# Patient Record
Sex: Female | Born: 1995 | Race: Black or African American | Hispanic: No | Marital: Single | Smoking: Never smoker
Health system: Southern US, Community
[De-identification: ages and names within clinical notes are randomized; demographics above are authoritative.]

## PROBLEM LIST (undated history)

## (undated) DIAGNOSIS — Z789 Other specified health status: Secondary | ICD-10-CM

## (undated) DIAGNOSIS — J45909 Unspecified asthma, uncomplicated: Secondary | ICD-10-CM

## (undated) DIAGNOSIS — R519 Headache, unspecified: Secondary | ICD-10-CM

## (undated) DIAGNOSIS — R87629 Unspecified abnormal cytological findings in specimens from vagina: Secondary | ICD-10-CM

## (undated) DIAGNOSIS — G4730 Sleep apnea, unspecified: Secondary | ICD-10-CM

## (undated) HISTORY — DX: Unspecified asthma, uncomplicated: J45.909

## (undated) HISTORY — DX: Unspecified abnormal cytological findings in specimens from vagina: R87.629

## (undated) HISTORY — PX: OTHER SURGICAL HISTORY: SHX169

## (undated) HISTORY — DX: Other specified health status: Z78.9

## (undated) HISTORY — DX: Headache, unspecified: R51.9

## (undated) HISTORY — DX: Sleep apnea, unspecified: G47.30

## (undated) HISTORY — PX: NO PAST SURGERIES: SHX2092

---
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
## 2016-05-17 ENCOUNTER — Encounter (HOSPITAL_COMMUNITY): Payer: Self-pay | Admitting: Emergency Medicine

## 2016-05-17 ENCOUNTER — Ambulatory Visit (HOSPITAL_COMMUNITY)
Admission: EM | Admit: 2016-05-17 | Discharge: 2016-05-17 | Disposition: A | Discharge status: Home / Self Care | Payer: BLUE CROSS/BLUE SHIELD | Attending: Internal Medicine | Admitting: Internal Medicine

## 2016-05-17 DIAGNOSIS — R05 Cough: Secondary | ICD-10-CM | POA: Diagnosis not present

## 2016-05-17 DIAGNOSIS — R062 Wheezing: Secondary | ICD-10-CM | POA: Diagnosis not present

## 2016-05-17 DIAGNOSIS — J209 Acute bronchitis, unspecified: Secondary | ICD-10-CM

## 2016-05-17 DIAGNOSIS — R059 Cough, unspecified: Secondary | ICD-10-CM

## 2016-05-17 MED ORDER — SODIUM CHLORIDE 0.9 % IN NEBU
INHALATION_SOLUTION | RESPIRATORY_TRACT | Status: AC
  Filled 2016-05-17: qty 3

## 2016-05-17 MED ORDER — ALBUTEROL SULFATE (2.5 MG/3ML) 0.083% IN NEBU
INHALATION_SOLUTION | RESPIRATORY_TRACT | Status: AC
  Filled 2016-05-17: qty 3

## 2016-05-17 MED ORDER — BENZONATATE 100 MG PO CAPS: 200.0000 mg | ORAL_CAPSULE | Freq: Three times a day (TID) | ORAL | 0 refills | Status: DC | PRN

## 2016-05-17 MED ORDER — ALBUTEROL SULFATE (2.5 MG/3ML) 0.083% IN NEBU
2.5000 mg | INHALATION_SOLUTION | Freq: Once | RESPIRATORY_TRACT | Status: AC
  Administered 2016-05-17: 2.5 mg via RESPIRATORY_TRACT

## 2016-05-17 MED ORDER — AZITHROMYCIN 250 MG PO TABS: 250.0000 mg | ORAL_TABLET | Freq: Every day | ORAL | 0 refills | Status: DC

## 2016-05-17 MED ORDER — METHYLPREDNISOLONE 4 MG PO TBPK: ORAL_TABLET | ORAL | 0 refills | Status: DC

## 2016-05-17 NOTE — ED Triage Notes (Signed)
The patient presented to the [facility] with a complaint of a cough x 1 month. 

## 2016-05-17 NOTE — ED Provider Notes (Signed)
CSN: [id number]     Arrival date & time 05/17/16  1854 History   None    Chief Complaint  Patient presents with  . Cough   (Consider location/radiation/quality/duration/timing/severity/associated sxs/prior Treatment) Patient c/o cough for a month.     The history is provided by the patient.  Cough  Cough characteristics:  Productive Sputum characteristics:  White Severity:  Moderate Onset quality:  Sudden Duration:  4 weeks Timing:  Constant Progression:  Worsening Chronicity:  New Smoker: no   Context: upper respiratory infection and weather changes   Relieved by:  Nothing Worsened by:  Nothing Ineffective treatments:  None tried   History reviewed. No pertinent past medical history. History reviewed. No pertinent surgical history. History reviewed. No pertinent family history. Social History  Substance Use Topics  . Smoking status: Never Smoker  . Smokeless tobacco: Never Used  . Alcohol use No   OB History    No data available     Review of Systems  Constitutional: Negative.   HENT: Negative.   Eyes: Negative.   Respiratory: Positive for cough.   Cardiovascular: Negative.   Gastrointestinal: Negative.   Endocrine: Negative.   Genitourinary: Negative.   Musculoskeletal: Negative.   Allergic/Immunologic: Negative.   Neurological: Negative.   Hematological: Negative.   Psychiatric/Behavioral: Negative.     Allergies  Patient has no known allergies.  Home Medications   Prior to Admission medications   Medication Sig Start Date End Date Taking? Authorizing Provider  azithromycin (ZITHROMAX) 250 MG tablet Take 1 tablet (250 mg total) by mouth daily. Take first 2 tablets together, then 1 every day until finished. 05/17/16   Deatra Canter, FNP  benzonatate (TESSALON) 100 MG capsule Take 2 capsules (200 mg total) by mouth 3 (three) times daily as needed for cough. 05/17/16   Deatra Canter, FNP  methylPREDNISolone (MEDROL DOSEPAK) 4 MG TBPK tablet Take  6-5-4-3-2-1 po qd 05/17/16   Deatra Canter, FNP   Meds Ordered and Administered this Visit   Medications  albuterol (PROVENTIL) (2.5 MG/3ML) 0.083% nebulizer solution 2.5 mg (2.5 mg Nebulization Given 05/17/16 2007)    BP (!) 146/90 (BP Location: Right Arm) Comment: notified cma  Pulse (!) 109 Comment: notified cma  Temp 98.3 F (36.8 C) (Oral)   Resp 16   SpO2 99%  No data found.   Physical Exam  Constitutional: She appears well-developed and well-nourished.  HENT:  Head: Normocephalic and atraumatic.  Right Ear: External ear normal.  Left Ear: External ear normal.  Mouth/Throat: Oropharynx is clear and moist.  Eyes: Conjunctivae and EOM are normal. Pupils are equal, round, and reactive to light.  Neck: Normal range of motion. Neck supple.  Cardiovascular: Normal rate, regular rhythm and normal heart sounds.   Pulmonary/Chest: Effort normal. She has wheezes.  Abdominal: Soft. Bowel sounds are normal.  Neurological: She is alert.  Nursing note and vitals reviewed.   Urgent Care Course     Procedures (including critical care time)  Labs Review Labs Reviewed - No data to display  Imaging Review No results found.   Visual Acuity Review  Right Eye Distance:   Left Eye Distance:   Bilateral Distance:    Right Eye Near:   Left Eye Near:    Bilateral Near:         MDM   1. Acute bronchitis, unspecified organism   2. Cough    Neb tx zpak Medrol dose pack Tessalon perles  Push po fluids, rest, tylenol and  motrin otc prn as directed for fever, arthralgias, and myalgias.  Follow up prn if sx's continue or persist.   Deatra Canter, FNP 05/17/16 2100

## 2016-09-03 ENCOUNTER — Ambulatory Visit (HOSPITAL_COMMUNITY)
Admission: EM | Admit: 2016-09-03 | Discharge: 2016-09-03 | Disposition: A | Discharge status: Home / Self Care | Payer: BLUE CROSS/BLUE SHIELD | Attending: Family Medicine | Admitting: Family Medicine

## 2016-09-03 ENCOUNTER — Encounter (HOSPITAL_COMMUNITY): Payer: Self-pay | Admitting: Family Medicine

## 2016-09-03 DIAGNOSIS — L308 Other specified dermatitis: Secondary | ICD-10-CM | POA: Diagnosis not present

## 2016-09-03 DIAGNOSIS — A084 Viral intestinal infection, unspecified: Secondary | ICD-10-CM

## 2016-09-03 MED ORDER — ONDANSETRON 8 MG PO TBDP: 8.0000 mg | ORAL_TABLET | Freq: Three times a day (TID) | ORAL | 0 refills | Status: DC | PRN

## 2016-09-03 MED ORDER — TRIAMCINOLONE ACETONIDE 0.1 % EX CREA: 1.0000 "application " | TOPICAL_CREAM | Freq: Two times a day (BID) | CUTANEOUS | 1 refills | Status: DC

## 2016-09-03 NOTE — ED Provider Notes (Signed)
[facility]    CSN: [id number] Arrival date & time: 09/03/16  1211     History   Chief Complaint Chief Complaint  Patient presents with  . Abdominal Pain    HPI Heather Barron is a 21 y.o. female.   This is a 21 year old woman who presents with an acute gastrointestinal upset including vomiting, epigastric discomfort, and diarrhea. Symptoms began yesterday and there is some improvement today although she has vomited earlier today and had some diarrhea. There's been no blood in the emesis or stool.  Patient works at [employer].      History reviewed. No pertinent past medical history.  There are no active problems to display for this patient.   History reviewed. No pertinent surgical history.  OB History    No data available       Home Medications    Prior to Admission medications   Medication Sig Start Date End Date Taking? Authorizing Provider  ondansetron (ZOFRAN-ODT) 8 MG disintegrating tablet Take 1 tablet (8 mg total) by mouth every 8 (eight) hours as needed for nausea. 09/03/16   Elvina Sidle, MD  triamcinolone cream (KENALOG) 0.1 % Apply 1 application topically 2 (two) times daily. 09/03/16   Elvina Sidle, MD    Family History No family history on file.  Social History Social History  Substance Use Topics  . Smoking status: Never Smoker  . Smokeless tobacco: Never Used  . Alcohol use No     Allergies   Patient has no known allergies.   Review of Systems Review of Systems  Gastrointestinal: Positive for abdominal pain, diarrhea, nausea and vomiting.  Neurological: Positive for light-headedness.  All other systems reviewed and are negative.    Physical Exam Triage Vital Signs ED Triage Vitals  Enc Vitals Group     BP      Pulse      Resp      Temp      Temp src      SpO2      Weight      Height      Head Circumference      Peak Flow      Pain Score      Pain Loc      Pain Edu?      Excl. in GC?     No data found.   Updated Vital Signs Pulse 80   Temp 98.5 F (36.9 C) (Oral)   Resp 16   SpO2 97%   Visual Acuity Right Eye Distance:   Left Eye Distance:   Bilateral Distance:    Right Eye Near:   Left Eye Near:    Bilateral Near:     Physical Exam  Constitutional: She is oriented to person, place, and time. She appears well-developed and well-nourished. No distress.  HENT:  Right Ear: External ear normal.  Left Ear: External ear normal.  Mouth/Throat: Oropharynx is clear and moist.  Eyes: Pupils are equal, round, and reactive to light.  Neck: Normal range of motion. Neck supple.  Pulmonary/Chest: Effort normal and breath sounds normal.  Abdominal: Soft. Bowel sounds are normal.  Musculoskeletal: Normal range of motion.  Neurological: She is alert and oriented to person, place, and time.  Skin: Skin is warm. Rash noted. She is not diaphoretic.  Nursing note and vitals reviewed.    UC Treatments / Results  Labs (all labs ordered are listed, but only abnormal results are displayed) Labs Reviewed - No data to  display  EKG  EKG Interpretation None       Radiology No results found.  Procedures Procedures (including critical care time)  Medications Ordered in UC Medications - No data to display   Initial Impression / Assessment and Plan / UC Course  I have reviewed the triage vital signs and the nursing notes.  Pertinent labs & imaging results that were available during my care of the patient were reviewed by me and considered in my medical decision making (see chart for details).     Final Clinical Impressions(s) / UC Diagnoses   Final diagnoses:  Viral gastroenteritis  Other eczema    New Prescriptions New Prescriptions   ONDANSETRON (ZOFRAN-ODT) 8 MG DISINTEGRATING TABLET    Take 1 tablet (8 mg total) by mouth every 8 (eight) hours as needed for nausea.   TRIAMCINOLONE CREAM (KENALOG) 0.1 %    Apply 1 application topically 2 (two) times  daily.     Controlled Substance Prescriptions [place] Controlled Substance Registry consulted? Not Applicable   Elvina Sidle, MD 09/03/16 1251

## 2016-09-03 NOTE — Discharge Instructions (Signed)
Stable with clear liquids today, crackers and toast this evening as tolerated.

## 2016-09-03 NOTE — ED Triage Notes (Signed)
PT reports generalized abdominal pain, vomiting and diarrhea that started this morning. PT ate something last night that may have been expired. PT reports vomit x2 and diarrhea x4.

## 2016-11-23 ENCOUNTER — Emergency Department (HOSPITAL_COMMUNITY): Payer: BLUE CROSS/BLUE SHIELD

## 2016-11-23 ENCOUNTER — Emergency Department (HOSPITAL_COMMUNITY)
Admission: EM | Admit: 2016-11-23 | Discharge: 2016-11-23 | Disposition: A | Discharge status: Home / Self Care | Payer: BLUE CROSS/BLUE SHIELD | Attending: Emergency Medicine | Admitting: Emergency Medicine

## 2016-11-23 ENCOUNTER — Encounter (HOSPITAL_COMMUNITY): Payer: Self-pay | Admitting: *Deleted

## 2016-11-23 DIAGNOSIS — M542 Cervicalgia: Secondary | ICD-10-CM | POA: Diagnosis present

## 2016-11-23 DIAGNOSIS — Z566 Other physical and mental strain related to work: Secondary | ICD-10-CM | POA: Insufficient documentation

## 2016-11-23 DIAGNOSIS — W010XXD Fall on same level from slipping, tripping and stumbling without subsequent striking against object, subsequent encounter: Secondary | ICD-10-CM | POA: Diagnosis not present

## 2016-11-23 DIAGNOSIS — Z79899 Other long term (current) drug therapy: Secondary | ICD-10-CM | POA: Diagnosis not present

## 2016-11-23 DIAGNOSIS — M436 Torticollis: Secondary | ICD-10-CM | POA: Insufficient documentation

## 2016-11-23 MED ORDER — METHOCARBAMOL 500 MG PO TABS: 500.0000 mg | ORAL_TABLET | Freq: Three times a day (TID) | ORAL | 0 refills | Status: AC | PRN

## 2016-11-23 NOTE — Discharge Instructions (Signed)
Take 600 mg ibuprofen + 1000 mg tylenol (acetominophen) + 500 mg robaxin every 8 hours (morning, afternoon, night) for the next 3-5 days. Heating pad, hot shower, light massage and neck stretches will help.   Return to ED if you develop fevers, chills, severe headache, vision changes, generalized rashes, confusion

## 2016-11-23 NOTE — ED Notes (Signed)
Pt reports stiff neck, worsening over past 5 days. Attempted icyhot with no relief.

## 2016-11-23 NOTE — ED Triage Notes (Signed)
C/o neck pain states she fell 5 days ago c/o pain onset 4 days ago , states she has been working a lot and now she is hoarse.

## 2016-11-23 NOTE — ED Provider Notes (Signed)
[facility] EMERGENCY DEPARTMENT Provider Note   CSN: [id number] Arrival date & time: 11/23/16  [id number]     History   Chief Complaint Chief Complaint  Patient presents with  . Torticollis    HPI Heather Barron is a 21 y.o. female presents to the ED for gradually worsening diffuse neck pain associated with tightness 4 days. Pain is worse with neck rotation to the right and palpation. Has tried icy hot with minimal relief. Reports she fell 5 days ago, one day before her symptoms started. She tripped and fell forward on her knees, there was no head trauma or LOC. 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 No fevers, chills, confusion, generalized rash, changes in vision or exposure to meningitis. She is up-to-date on vaccinations. Has no other complaints.  HPI  History reviewed. No pertinent past medical history.  There are no active problems to display for this patient.   History reviewed. No pertinent surgical history.  OB History    No data available       Home Medications    Prior to Admission medications   Medication Sig Start Date End Date Taking? Authorizing Provider  methocarbamol (ROBAXIN) 500 MG tablet Take 1 tablet (500 mg total) every 8 (eight) hours as needed for up to 5 days by mouth for muscle spasms. 11/23/16 11/28/16  Liberty HandyGibbons, Caldwell Kronenberger J, PA-C  ondansetron (ZOFRAN-ODT) 8 MG disintegrating tablet Take 1 tablet (8 mg total) by mouth every 8 (eight) hours as needed for nausea. 09/03/16   Elvina SidleLauenstein, Kurt, MD  triamcinolone cream (KENALOG) 0.1 % Apply 1 application topically 2 (two) times daily. 09/03/16   Elvina SidleLauenstein, Kurt, MD    Family History No family history on file.  Social History Social History   Tobacco Use  . Smoking status: Never Smoker  . Smokeless tobacco: Never Used  Substance  Use Topics  . Alcohol use: No  . Drug use: No     Allergies   Patient has no known allergies.   Review of Systems Review of Systems  All other systems reviewed and are negative.    Physical Exam Updated Vital Signs BP 118/69 (BP Location: Right Arm)   Pulse 93   Temp 99.2 F (37.3 C) (Oral)   Resp 20   Ht 5\' 4"  (1.626 m)   Wt 104.3 kg (230 lb)   LMP 11/10/2016   SpO2 99%   BMI 39.48 kg/m   Physical Exam  Constitutional: She is oriented to person, place, and time. She appears well-developed and well-nourished. No distress.  NAD.  HENT:  Head: Normocephalic and atraumatic.  Right Ear: External ear normal.  Left Ear: External ear normal.  Nose: Nose normal.  Eyes: Conjunctivae are normal. No scleral icterus.  PERRL and EOMs intact bilaterally  Neck: Neck supple. Muscular tenderness present. Decreased range of motion present.    Diffuse muscular tenderness to paraspinal c-spine No midline C-spine tenderness or step-offs She is full passive range of motion of her cervical spine with mild pain No meningeal signs  Cardiovascular: Normal rate, regular rhythm and normal heart sounds.  No murmur heard. Pulmonary/Chest: Effort normal and breath sounds normal. She has no wheezes.  Musculoskeletal: She exhibits no deformity.  Neurological: She is alert and oriented to person, place, and time.  No dysarthria or nystagmus.  Strength 5/5 with hand  grip and ankle flexion/extension.   Sensation to light touch intact in hands and feet. Steady gait.  CN I and VIII not tested. CN II-XII intact bilaterally.   Skin: Skin is warm and dry. Capillary refill takes less than 2 seconds.  Psychiatric: She has a normal mood and affect. Her behavior is normal. Judgment and thought content normal.  Nursing note and vitals reviewed.    ED Treatments / Results  Labs (all labs ordered are listed, but only abnormal results are displayed) Labs Reviewed - No data to display  EKG  EKG  Interpretation None       Radiology Dg Cervical Spine Complete  Result Date: 11/23/2016 CLINICAL DATA:  Fall 5 days ago. The patient has been quite active with or since then and has increased back pain. EXAM: CERVICAL SPINE - COMPLETE 4+ VIEW COMPARISON:  None in PACs FINDINGS: The cervical vertebral bodies are preserved in height. The disc space heights are well maintained. The oblique views reveal no significant bony encroachment upon the neural foramina. The spinous processes are grossly intact. The odontoid is intact. The prevertebral soft tissue spaces are normal. IMPRESSION: No acute compression fracture nor other acute bony abnormality is observed. Electronically Signed   By: David  SwazilandJordan M.D.   On: 11/23/2016 07:46    Procedures Procedures (including critical care time)  Medications Ordered in ED Medications - No data to display   Initial Impression / Assessment and Plan / ED Course  I have reviewed the triage vital signs and the nursing notes.  Pertinent labs & imaging results that were available during my care of the patient were reviewed by me and considered in my medical decision making (see chart for details).    [age] year old female presents with gradually worsening neck pain after mechanical fall from standing height.Exam is reassuring, she has mild diffuse tenderness over paraspinal cervical muscles but no midline C-spine tenderness or step-offs. Triage ordered x-ray of cervical spine is unremarkable. She has no meningeal signs. Cranial nerves and upper extremities are neurovascularly intact. Will discharge with conservative measures and f/u for re-eval for persistent symptoms. Discussed s/s that would warrant prompt return to ED for re-evaluation. Pt requested work note for 4 days, given.   Final Clinical Impressions(s) / ED Diagnoses   Final diagnoses:  Torticollis, acute    ED Discharge Orders        Ordered    methocarbamol (ROBAXIN) 500 MG tablet  Every 8 hours  PRN     11/23/16 0950       Liberty HandyGibbons, Christain Mcraney J, PA-C 11/23/16 1010    Melene PlanFloyd, Dan, DO 11/23/16 1430

## 2017-01-12 ENCOUNTER — Emergency Department
Admission: EM | Admit: 2017-01-12 | Discharge: 2017-01-12 | Disposition: A | Discharge status: Home / Self Care | Payer: BLUE CROSS/BLUE SHIELD | Attending: Emergency Medicine | Admitting: Emergency Medicine

## 2017-01-12 ENCOUNTER — Other Ambulatory Visit: Payer: Self-pay

## 2017-01-12 ENCOUNTER — Encounter: Payer: Self-pay | Admitting: Emergency Medicine

## 2017-01-12 ENCOUNTER — Emergency Department: Payer: BLUE CROSS/BLUE SHIELD

## 2017-01-12 DIAGNOSIS — Z79899 Other long term (current) drug therapy: Secondary | ICD-10-CM | POA: Diagnosis not present

## 2017-01-12 DIAGNOSIS — G5761 Lesion of plantar nerve, right lower limb: Secondary | ICD-10-CM | POA: Diagnosis not present

## 2017-01-12 DIAGNOSIS — M79671 Pain in right foot: Secondary | ICD-10-CM | POA: Diagnosis present

## 2017-01-12 MED ORDER — PREDNISONE 10 MG PO TABS: 20.0000 mg | ORAL_TABLET | Freq: Two times a day (BID) | ORAL | 0 refills | Status: AC

## 2017-01-12 MED ORDER — PREDNISONE 20 MG PO TABS
20.0000 mg | ORAL_TABLET | Freq: Once | ORAL | Status: AC
  Administered 2017-01-12: 20 mg via ORAL
  Filled 2017-01-12: qty 1

## 2017-01-12 NOTE — ED Provider Notes (Signed)
[facility] Emergency Department Provider Note ____________________________________________  Time seen: [id number]  I have reviewed the triage vital signs and the nursing notes.  HISTORY  Chief Complaint  Foot Pain  HPI Heather Barron is a 21 y.o. female resents herself to the ED for evaluation of sudden right foot pain with onset yesterday.  Patient describes pain to the balls of her first through third toes.  She describes the pain discomfort as exquisite in nature and she is unable to bear weight through the toes, due to pain. She denies any trauma, contusion, or sprain. She has applied Ben-gay, without benefit. She denies any ankle or calf tenderness.   History reviewed. No pertinent past medical history.  There are no active problems to display for this patient.  History reviewed. No pertinent surgical history.  Prior to Admission medications   Medication Sig Start Date End Date Taking? Authorizing Provider  ondansetron (ZOFRAN-ODT) 8 MG disintegrating tablet Take 1 tablet (8 mg total) by mouth every 8 (eight) hours as needed for nausea. 09/03/16   Elvina SidleLauenstein, Kurt, MD  predniSONE (DELTASONE) 10 MG tablet Take 2 tablets (20 mg total) by mouth 2 (two) times daily with a meal for 5 days. 01/12/17 01/17/17  Ladeja Pelham, Charlesetta IvoryJenise V Bacon, PA-C  triamcinolone cream (KENALOG) 0.1 % Apply 1 application topically 2 (two) times daily. 09/03/16   Elvina SidleLauenstein, Kurt, MD    Allergies Patient has no known allergies.  History reviewed. No pertinent family history.  Social History Social History   Tobacco Use  . Smoking status: Never Smoker  . Smokeless tobacco: Never Used  Substance Use Topics  . Alcohol use: No  . Drug use: No    Review of Systems  Constitutional: Negative for fever. Musculoskeletal: Negative for back pain. Right foot pain as above.  Skin: Negative for rash. Neurological: Negative for headaches, focal weakness or  numbness. ____________________________________________  PHYSICAL EXAM:  VITAL SIGNS: ED Triage Vitals  Enc Vitals Group     BP 01/12/17 1700 100/71     Pulse Rate 01/12/17 1700 95     Resp 01/12/17 1700 18     Temp 01/12/17 1700 98.1 F (36.7 C)     Temp Source 01/12/17 1700 Oral     SpO2 01/12/17 1700 99 %     Weight 01/12/17 1702 240 lb (108.9 kg)     Height 01/12/17 1700 5\' 4"  (1.626 m)     Head Circumference --      Peak Flow --      Pain Score 01/12/17 1700 10     Pain Loc --      Pain Edu? --      Excl. in GC? --     Constitutional: Alert and oriented. Well appearing and in no distress. Head: Normocephalic and atraumatic. Cardiovascular: Normal rate, regular rhythm. Normal distal pulses.  Capillary refill. Respiratory: Normal respiratory effort.  Musculoskeletal: Right foot without obvious deformity, effusion, or dislocation.  Patient is exquisitely tender to palpation to the plantar surface of her first through third MTPs.  She is able to demonstrate normal toe flexion/ extension range.  She has no ankle dysfunction.  No calf or Achilles tenderness is elicited.  Nontender with normal range of motion in all extremities.  Neurologic: Antalgic gait without ataxia. Normal gross sensation. No gross focal neurologic deficits are appreciated. Skin:  Skin is warm, dry and intact. No rash noted. ___________________________________________   RADIOLOGY Right Foot  IMPRESSION: No acute fracture or dislocation identified about  the right foot. Midfoot soft tissue swelling. ____________________________________________  PROCEDURES  Procedures Prednisone 20 mg PO Crutches Ace bandage ____________________________________________  INITIAL IMPRESSION / ASSESSMENT AND PLAN / ED COURSE  She with a ED evaluation of sudden plantar foot pain to the MTPs.  Her symptoms appear consistent with a Morton's neuroma.  Patient's x-rays negative for any acute fracture dislocation, and her  story is does not give any significant mechanism of injury.  She will be fitted with crutches to ambulate with weightbearing as tolerated.  She is referred to podiatry for further evaluation management.  A prescription for prednisone will be provided for her to take as directed.   ____________________________________________  FINAL CLINICAL IMPRESSION(S) / ED DIAGNOSES  Final diagnoses:  Morton's neuroma of right foot      Lissa HoardMenshew, Raelan Burgoon V Bacon, PA-C 01/12/17 1833    Jeanmarie PlantMcShane, James A, MD 01/12/17 2040

## 2017-01-12 NOTE — ED Triage Notes (Signed)
Pt presents with right foot pain since yesterday. Pt states she has pain when bearing weight. Pt reports pain on the bottom of the foot. Pt alert & oriented with NAD noted.

## 2017-01-12 NOTE — Discharge Instructions (Addendum)
Take the prescription steroids as directed. Wear the ace bandage and use the crutches as directed. Rest with the foot elevated. Apply warm compresses or use foot soaks to reduce pain. Follow-up with Dr. Ether GriffinsFowler for continued symptoms.

## 2017-01-12 NOTE — ED Notes (Signed)

## 2017-05-09 ENCOUNTER — Encounter: Payer: Self-pay | Admitting: Emergency Medicine

## 2017-05-09 ENCOUNTER — Emergency Department: Payer: Self-pay

## 2017-05-09 ENCOUNTER — Other Ambulatory Visit: Payer: Self-pay

## 2017-05-09 ENCOUNTER — Emergency Department
Admission: EM | Admit: 2017-05-09 | Discharge: 2017-05-09 | Disposition: A | Discharge status: Home / Self Care | Payer: Self-pay | Attending: Emergency Medicine | Admitting: Emergency Medicine

## 2017-05-09 DIAGNOSIS — M79672 Pain in left foot: Secondary | ICD-10-CM | POA: Insufficient documentation

## 2017-05-09 MED ORDER — NAPROXEN 500 MG PO TABS: 500.0000 mg | ORAL_TABLET | Freq: Two times a day (BID) | ORAL | Status: DC

## 2017-05-09 MED ORDER — TRAMADOL HCL 50 MG PO TABS: 50.0000 mg | ORAL_TABLET | Freq: Two times a day (BID) | ORAL | 0 refills | Status: DC | PRN

## 2017-05-09 MED ORDER — NAPROXEN 500 MG PO TABS
500.0000 mg | ORAL_TABLET | Freq: Once | ORAL | Status: AC
  Administered 2017-05-09: 500 mg via ORAL
  Filled 2017-05-09: qty 1

## 2017-05-09 NOTE — Discharge Instructions (Addendum)
Wear open shoe 2-3 days as needed.

## 2017-05-09 NOTE — ED Notes (Signed)
Splint applied to left foot per order.

## 2017-05-09 NOTE — ED Provider Notes (Signed)
[facility] Emergency Department Provider Note   ____________________________________________   First MD Initiated Contact with Patient 05/09/17 1049     (approximate)  I have reviewed the triage vital signs and the nursing notes.   HISTORY  Chief Complaint Foot Pain    HPI Heather Barron is a 22 y.o. female patient complain of left foot pain secondary to running incident yesterday.  Patient states she felt a" crack" in her foot yesterday.  Patient states pain increased with weightbearing.  Patient rates pain as a 2/10.  Patient described the pain as "achy".  No past medical history on file.  There are no active problems to display for this patient.   History reviewed. No pertinent surgical history.  Prior to Admission medications   Medication Sig Start Date End Date Taking? Authorizing Provider  naproxen (NAPROSYN) 500 MG tablet Take 1 tablet (500 mg total) by mouth 2 (two) times daily with a meal. 05/09/17   Joni ReiningSmith, Kajsa Butrum K, PA-C  ondansetron (ZOFRAN-ODT) 8 MG disintegrating tablet Take 1 tablet (8 mg total) by mouth every 8 (eight) hours as needed for nausea. 09/03/16   Elvina SidleLauenstein, Kurt, MD  traMADol (ULTRAM) 50 MG tablet Take 1 tablet (50 mg total) by mouth every 12 (twelve) hours as needed. 05/09/17   Joni ReiningSmith, Latorsha Curling K, PA-C  triamcinolone cream (KENALOG) 0.1 % Apply 1 application topically 2 (two) times daily. 09/03/16   Elvina SidleLauenstein, Kurt, MD    Allergies Patient has no known allergies.  No family history on file.  Social History Social History   Tobacco Use  . Smoking status: Never Smoker  . Smokeless tobacco: Never Used  Substance Use Topics  . Alcohol use: No  . Drug use: No    Review of Systems Constitutional: No fever/chills Eyes: No visual changes. ENT: No sore throat. Cardiovascular: Denies chest pain. Respiratory: Denies shortness of breath. Gastrointestinal: No abdominal pain.  No nausea, no vomiting.  No diarrhea.  No  constipation. Genitourinary: Negative for dysuria. Musculoskeletal: Negative for back pain. Skin: Negative for rash. Neurological: Negative for headaches, focal weakness or numbness.   ____________________________________________   PHYSICAL EXAM:  VITAL SIGNS: ED Triage Vitals  Enc Vitals Group     BP 05/09/17 1111 (!) 120/58     Pulse Rate 05/09/17 1111 86     Resp 05/09/17 1111 16     Temp 05/09/17 1111 98.2 F (36.8 C)     Temp Source 05/09/17 1111 Oral     SpO2 05/09/17 1111 99 %     Weight 05/09/17 1112 284 lb (128.8 kg)     Height 05/09/17 1112 5\' 4"  (1.626 m)     Head Circumference --      Peak Flow --      Pain Score 05/09/17 1111 2     Pain Loc --      Pain Edu? --      Excl. in GC? --    Constitutional: Alert and oriented. Well appearing and in no acute distress.  Morbid obesity Cardiovascular: Normal rate, regular rhythm. Grossly normal heart sounds.  Good peripheral circulation. Respiratory: Normal respiratory effort.  No retractions. Lungs CTAB. Musculoskeletal: No obvious deformity to the left foot.  Patient has moderate guarding palpation Neurologic:  Normal speech and language. No gross focal neurologic deficits are appreciated. No gait instability. Skin:  Skin is warm, dry and intact. No rash noted. Psychiatric: Mood and affect are normal. Speech and behavior are normal.  ____________________________________________   LABS (  all labs ordered are listed, but only abnormal results are displayed)  Labs Reviewed - No data to display ____________________________________________  EKG   ____________________________________________  RADIOLOGY  No acute findings x-ray of the right foot.  Official radiology report(s): Dg Foot Complete Left  Result Date: 05/09/2017 CLINICAL DATA:  Running yesterday with pain in the bottom of foot. Initial encounter. EXAM: LEFT FOOT - COMPLETE 3+ VIEW COMPARISON:  None. FINDINGS: There is no evidence of fracture or  dislocation. There is no evidence of arthropathy or other focal bone abnormality. Soft tissues are unremarkable. IMPRESSION: Negative. Electronically Signed   By: Marnee Spring M.D.   On: 05/09/2017 12:06    ____________________________________________   PROCEDURES  Procedure(s) performed: None  Procedures  Critical Care performed: No  ____________________________________________   INITIAL IMPRESSION / ASSESSMENT AND PLAN / ED COURSE  As part of my medical decision making, I reviewed the following data within the electronic MEDICAL RECORD NUMBER    Right foot pain secondary to sprain.  Discussed negative x-ray findings with patient.  Patient given discharge care instruction advised take medication as directed.  [facility].      ____________________________________________   FINAL CLINICAL IMPRESSION(S) / ED DIAGNOSES  Final diagnoses:  Foot pain, left     ED Discharge Orders        Ordered    naproxen (NAPROSYN) 500 MG tablet  2 times daily with meals     05/09/17 1225    traMADol (ULTRAM) 50 MG tablet  Every 12 hours PRN     05/09/17 1225       Note:  This document was prepared using Dragon voice recognition software and may include unintentional dictation errors.    Joni Reining, PA-C 05/09/17 1227    Arnaldo Natal, MD 05/09/17 [phone]

## 2017-05-09 NOTE — ED Notes (Signed)
Pt resting in bed, no complaints at this time.

## 2017-05-09 NOTE — ED Triage Notes (Signed)
Running yesterday.  Today pain bottom of foot and cant bear weigt.  Says she heard a crack yesterday.

## 2017-06-13 ENCOUNTER — Other Ambulatory Visit: Payer: Self-pay

## 2017-06-13 ENCOUNTER — Encounter: Payer: Self-pay | Admitting: Emergency Medicine

## 2017-06-13 DIAGNOSIS — J020 Streptococcal pharyngitis: Secondary | ICD-10-CM | POA: Insufficient documentation

## 2017-06-13 NOTE — ED Triage Notes (Signed)
Patient ambulatory to triage with steady gait, without difficulty or distress noted; pt reports sore throat x 4 days with no accomp symptoms

## 2017-06-14 ENCOUNTER — Emergency Department
Admission: EM | Admit: 2017-06-14 | Discharge: 2017-06-14 | Disposition: A | Discharge status: Home / Self Care | Payer: Self-pay | Attending: Emergency Medicine | Admitting: Emergency Medicine

## 2017-06-14 DIAGNOSIS — J020 Streptococcal pharyngitis: Secondary | ICD-10-CM

## 2017-06-14 LAB — GROUP A STREP BY PCR: Group A Strep by PCR: DETECTED — AB

## 2017-06-14 MED ORDER — LIDOCAINE VISCOUS HCL 2 % MT SOLN: 15.0000 mL | OROMUCOSAL | 0 refills | Status: DC | PRN

## 2017-06-14 MED ORDER — AMOXICILLIN 500 MG PO CAPS
1000.0000 mg | ORAL_CAPSULE | Freq: Once | ORAL | Status: AC
  Administered 2017-06-14: 1000 mg via ORAL
  Filled 2017-06-14: qty 2

## 2017-06-14 MED ORDER — DEXAMETHASONE 10 MG/ML FOR PEDIATRIC ORAL USE
10.0000 mg | Freq: Once | INTRAMUSCULAR | Status: AC
  Administered 2017-06-14: 10 mg via ORAL

## 2017-06-14 MED ORDER — KETOROLAC TROMETHAMINE 60 MG/2ML IM SOLN
60.0000 mg | Freq: Once | INTRAMUSCULAR | Status: AC
  Administered 2017-06-14: 60 mg via INTRAMUSCULAR
  Filled 2017-06-14: qty 2

## 2017-06-14 MED ORDER — DEXAMETHASONE SODIUM PHOSPHATE 10 MG/ML IJ SOLN
INTRAMUSCULAR | Status: AC
  Administered 2017-06-14: 10 mg via ORAL
  Filled 2017-06-14: qty 1

## 2017-06-14 MED ORDER — TRAMADOL HCL 50 MG PO TABS: 50.0000 mg | ORAL_TABLET | Freq: Four times a day (QID) | ORAL | 0 refills | Status: DC | PRN

## 2017-06-14 MED ORDER — AMOXICILLIN 875 MG PO TABS: 875.0000 mg | ORAL_TABLET | Freq: Two times a day (BID) | ORAL | 0 refills | Status: DC

## 2017-06-14 NOTE — ED Provider Notes (Signed)
[facility] Emergency Department Provider Note   ____________________________________________   First MD Initiated Contact with Patient 06/14/17 0246     (approximate)  I have reviewed the triage vital signs and the nursing notes.   HISTORY  Chief Complaint Sore Throat    HPI Heather Barron is a 22 y.o. female who comes into the hospital today with a sore throat.  The patient has had the symptoms for the past 4 days.  The patient states she has been gargling with salt water to help her pain but it has not helped.  The patient denies any fevers, nausea, vomiting.  She is never had these symptoms before.  The patient denies any sick contacts.  She rates her pain a 10 out of 10 in intensity currently.  She is here today for evaluation.   History reviewed. No pertinent past medical history.  There are no active problems to display for this patient.   History reviewed. No pertinent surgical history.  Prior to Admission medications   Medication Sig Start Date End Date Taking? Authorizing Provider  amoxicillin (AMOXIL) 875 MG tablet Take 1 tablet (875 mg total) by mouth 2 (two) times daily. 06/14/17   Rebecka Apley, MD  lidocaine (XYLOCAINE) 2 % solution Use as directed 15 mLs in the mouth or throat every 4 (four) hours as needed for mouth pain. 06/14/17   Rebecka Apley, MD  naproxen (NAPROSYN) 500 MG tablet Take 1 tablet (500 mg total) by mouth 2 (two) times daily with a meal. 05/09/17   Joni Reining, PA-C  ondansetron (ZOFRAN-ODT) 8 MG disintegrating tablet Take 1 tablet (8 mg total) by mouth every 8 (eight) hours as needed for nausea. 09/03/16   Elvina Sidle, MD  traMADol (ULTRAM) 50 MG tablet Take 1 tablet (50 mg total) by mouth every 12 (twelve) hours as needed. 05/09/17   Joni Reining, PA-C  traMADol (ULTRAM) 50 MG tablet Take 1 tablet (50 mg total) by mouth every 6 (six) hours as needed. 06/14/17   Rebecka Apley, MD  triamcinolone  cream (KENALOG) 0.1 % Apply 1 application topically 2 (two) times daily. 09/03/16   Elvina Sidle, MD    Allergies Patient has no known allergies.  No family history on file.  Social History Social History   Tobacco Use  . Smoking status: Never Smoker  . Smokeless tobacco: Never Used  Substance Use Topics  . Alcohol use: No  . Drug use: No    Review of Systems  Constitutional: No fever/chills Eyes: No visual changes. ENT:  sore throat. Cardiovascular: Denies chest pain. Respiratory: Denies shortness of breath. Gastrointestinal: No abdominal pain.  No nausea, no vomiting.   Genitourinary: Negative for dysuria. Musculoskeletal: Negative for back pain. Skin: Negative for rash. Neurological: Negative for headaches   ____________________________________________   PHYSICAL EXAM:  VITAL SIGNS: ED Triage Vitals  Enc Vitals Group     BP 06/13/17 2349 118/87     Pulse Rate 06/13/17 2349 74     Resp 06/13/17 2349 18     Temp 06/13/17 2349 98.4 F (36.9 C)     Temp Source 06/13/17 2349 Oral     SpO2 06/13/17 2349 96 %     Weight 06/13/17 2336 287 lb (130.2 kg)     Height 06/13/17 2336  (1.626 m)     Head Circumference --      Peak Flow --      Pain Score 06/13/17 2336 10  Pain Loc --      Pain Edu? --      Excl. in GC? --     Constitutional: Alert and oriented. Well appearing and in mild distress. Eyes: Conjunctivae are normal. PERRL. EOMI. Head: Atraumatic. Nose: No congestion/rhinnorhea. Mouth/Throat: Mucous membranes are moist.  Oropharynx mildly erythematous.  No unilateral tonsil swelling Hematological/Lymphatic/Immunilogical: Anterior cervical lymphadenopathy. Cardiovascular: Normal rate, regular rhythm. Grossly normal heart sounds.  Good peripheral circulation. Respiratory: Normal respiratory effort.  No retractions. Lungs CTAB. Gastrointestinal: Soft and nontender. No distention.  Musculoskeletal: No lower extremity tenderness nor edema.     Neurologic:  Normal speech and language. Skin:  Skin is warm, dry and intact.  Psychiatric: Mood and affect are normal.   ____________________________________________   LABS (all labs ordered are listed, but only abnormal results are displayed)  Labs Reviewed  GROUP A STREP BY PCR - Abnormal; Notable for the following components:      Result Value   Group A Strep by PCR DETECTED (*)    All other components within normal limits   ____________________________________________  EKG  none ____________________________________________  RADIOLOGY  ED MD interpretation:  none  Official radiology report(s): No results found.  ____________________________________________   PROCEDURES  Procedure(s) performed: None  Procedures  Critical Care performed: No  ____________________________________________   INITIAL IMPRESSION / ASSESSMENT AND PLAN / ED COURSE  As part of my medical decision making, I reviewed the following data within the electronic MEDICAL RECORD NUMBER Notes from prior ED visits and [place] Controlled Substance Database   This is a 22 year old female who comes into the hospital today with some sore throat for the past 4 days.  We did check a strep on the patient and the strep did return positive.  I gave the patient a shot of Toradol, amoxicillin and some dexamethasone.  The patient's vital signs are unremarkable.  She will be discharged home to follow-up with the acute care clinic.      ____________________________________________   FINAL CLINICAL IMPRESSION(S) / ED DIAGNOSES  Final diagnoses:  Pharyngitis due to Streptococcus species  Strep throat     ED Discharge Orders        Ordered    lidocaine (XYLOCAINE) 2 % solution  Every 4 hours PRN     06/14/17 0335    amoxicillin (AMOXIL) 875 MG tablet  2 times daily     06/14/17 0335    traMADol (ULTRAM) 50 MG tablet  Every 6 hours PRN     06/14/17 0335       Note:  This document was prepared using  Dragon voice recognition software and may include unintentional dictation errors.    Rebecka Apley, MD 06/14/17 [phone]

## 2017-06-14 NOTE — Discharge Instructions (Addendum)
Please follow-up with your primary care physician, please return with any worsening pain any difficulty swallowing or any other concerns.

## 2017-08-11 ENCOUNTER — Emergency Department
Admission: EM | Admit: 2017-08-11 | Discharge: 2017-08-11 | Disposition: A | Discharge status: Home / Self Care | Payer: Self-pay | Attending: Emergency Medicine | Admitting: Emergency Medicine

## 2017-08-11 DIAGNOSIS — Z79899 Other long term (current) drug therapy: Secondary | ICD-10-CM | POA: Insufficient documentation

## 2017-08-11 DIAGNOSIS — L03311 Cellulitis of abdominal wall: Secondary | ICD-10-CM | POA: Insufficient documentation

## 2017-08-11 MED ORDER — CLINDAMYCIN HCL 300 MG PO CAPS: 300.0000 mg | ORAL_CAPSULE | Freq: Four times a day (QID) | ORAL | 0 refills | Status: DC

## 2017-08-11 MED ORDER — CLINDAMYCIN PHOSPHATE 300 MG/2ML IJ SOLN
300.0000 mg | Freq: Once | INTRAMUSCULAR | Status: AC
  Administered 2017-08-11: 300 mg via INTRAMUSCULAR

## 2017-08-11 NOTE — ED Provider Notes (Signed)
[facility] Emergency Department Provider Note  ____________________________________________  Time seen: Approximately 8:56 PM  I have reviewed the triage vital signs and the nursing notes.   HISTORY  Chief Complaint Abscess    HPI Heather Barron is a 22 y.o. female who presents the emergency department complaining of boil/abscess to the right side.  Patient reports that she has a painful skin lesion in the skin folds of her abdomen.  Patient denies any drainage of same.  She does have a history of recurrent skin lesions.  Patient denies any fevers or chills, abdominal pain, nausea or vomiting.  No medication for this complaint prior to arrival.  No other complaints at this time.    History reviewed. No pertinent past medical history.  There are no active problems to display for this patient.   History reviewed. No pertinent surgical history.  Prior to Admission medications   Medication Sig Start Date End Date Taking? Authorizing Provider  amoxicillin (AMOXIL) 875 MG tablet Take 1 tablet (875 mg total) by mouth 2 (two) times daily. 06/14/17   Rebecka ApleyWebster, Allison P, MD  clindamycin (CLEOCIN) 300 MG capsule Take 1 capsule (300 mg total) by mouth 4 (four) times daily. 08/11/17   Maxden Naji, Delorise RoyalsJonathan D, PA-C  lidocaine (XYLOCAINE) 2 % solution Use as directed 15 mLs in the mouth or throat every 4 (four) hours as needed for mouth pain. 06/14/17   Rebecka ApleyWebster, Allison P, MD  naproxen (NAPROSYN) 500 MG tablet Take 1 tablet (500 mg total) by mouth 2 (two) times daily with a meal. 05/09/17   Joni ReiningSmith, Ronald K, PA-C  ondansetron (ZOFRAN-ODT) 8 MG disintegrating tablet Take 1 tablet (8 mg total) by mouth every 8 (eight) hours as needed for nausea. 09/03/16   Elvina SidleLauenstein, Kurt, MD  traMADol (ULTRAM) 50 MG tablet Take 1 tablet (50 mg total) by mouth every 12 (twelve) hours as needed. 05/09/17   Joni ReiningSmith, Ronald K, PA-C  traMADol (ULTRAM) 50 MG tablet Take 1 tablet (50 mg total) by mouth  every 6 (six) hours as needed. 06/14/17   Rebecka ApleyWebster, Allison P, MD  triamcinolone cream (KENALOG) 0.1 % Apply 1 application topically 2 (two) times daily. 09/03/16   Elvina SidleLauenstein, Kurt, MD    Allergies Patient has no known allergies.  No family history on file.  Social History Social History   Tobacco Use  . Smoking status: Never Smoker  . Smokeless tobacco: Never Used  Substance Use Topics  . Alcohol use: No  . Drug use: No     Review of Systems  Constitutional: No fever/chills Eyes: No visual changes. Cardiovascular: no chest pain. Respiratory: no cough. No SOB. Gastrointestinal: No abdominal pain.  No nausea, no vomiting.  Musculoskeletal: Negative for musculoskeletal pain. Skin: Negative for rash, abrasions, lacerations, ecchymosis.  Positive for painful skin lesion to the right side in the skin folds of the abdomen. Neurological: Negative for headaches, focal weakness or numbness. 10-point ROS otherwise negative.  ____________________________________________   PHYSICAL EXAM:  VITAL SIGNS: ED Triage Vitals [08/11/17 2029]  Enc Vitals Group     BP 114/90     Pulse Rate 89     Resp 18     Temp 98.9 F (37.2 C)     Temp Source Oral     SpO2 100 %     Weight 280 lb (127 kg)     Height 5\' 4"  (1.626 m)     Head Circumference      Peak Flow  Pain Score 8     Pain Loc      Pain Edu?      Excl. in GC?      Constitutional: Alert and oriented. Well appearing and in no acute distress.  Morbidly obese. Eyes: Conjunctivae are normal. PERRL. EOMI. Head: Atraumatic. ENT:      Ears:       Nose: No congestion/rhinnorhea.      Mouth/Throat: Mucous membranes are moist.  Neck: No stridor.    Cardiovascular: Normal rate, regular rhythm. Normal S1 and S2.  Good peripheral circulation. Respiratory: Normal respiratory effort without tachypnea or retractions. Lungs CTAB. Good air entry to the bases with no decreased or absent breath sounds. Gastrointestinal: Bowel sounds  4 quadrants. Soft and nontender to palpation. No guarding or rigidity. No palpable masses. No distention.  Musculoskeletal: Full range of motion to all extremities. No gross deformities appreciated. Neurologic:  Normal speech and language. No gross focal neurologic deficits are appreciated.  Skin:  Skin is warm, dry and intact. No rash noted.  Erythematous skin lesion noted in the skin folds of the right side of the abdomen.  Area is firm to palpation with no fluctuance or induration.  There is very tender to palpation.  Total erythema measures approximately 3 cm in diameter. Psychiatric: Mood and affect are normal. Speech and behavior are normal. Patient exhibits appropriate insight and judgement.   ____________________________________________   LABS (all labs ordered are listed, but only abnormal results are displayed)  Labs Reviewed - No data to display ____________________________________________  EKG   ____________________________________________  RADIOLOGY   No results found.  ____________________________________________    PROCEDURES  Procedure(s) performed:    Procedures    Medications  clindamycin (CLEOCIN) injection 300 mg (has no administration in time range)     ____________________________________________   INITIAL IMPRESSION / ASSESSMENT AND PLAN / ED COURSE  Pertinent labs & imaging results that were available during my care of the patient were reviewed by me and considered in my medical decision making (see chart for details).  Review of the [place] CSRS was performed in accordance of the [facility] prior to dispensing any controlled drugs.      Patient's diagnosis is consistent with cellulitis of the right abdominal wall.  Patient presents emerged with skin lesion to the right abdominal wall and skin folds.  No indication of abscess requiring incision and drainage, labs or imaging.  Patient is given injection of clindamycin and will be discharged home with  prescription for clindamycin.  Patient is advised to use probiotics or use yogurt over the next several days for protection of her stomach..  Patient will follow primary care as needed.  Patient is given ED precautions to return to the ED for any worsening or new symptoms.     ____________________________________________  FINAL CLINICAL IMPRESSION(S) / ED DIAGNOSES  Final diagnoses:  Cellulitis of abdominal wall      NEW MEDICATIONS STARTED DURING THIS VISIT:  ED Discharge Orders        Ordered    clindamycin (CLEOCIN) 300 MG capsule  4 times daily     08/11/17 2059          This chart was dictated using voice recognition software/Dragon. Despite best efforts to proofread, errors can occur which can change the meaning. Any change was purely unintentional.    Racheal Patches, PA-C 08/11/17 2059    Pershing Proud Myra Rude, MD 08/11/17 [phone]

## 2017-08-11 NOTE — ED Triage Notes (Signed)
Patient c/o abscess to posterior mid back, right side X 2 days.

## 2017-08-11 NOTE — ED Notes (Signed)
Pt has reddened area on the right side in the fold of skin. The area is hard and the patient is uncomfortable with palpation of the area.

## 2017-10-03 ENCOUNTER — Other Ambulatory Visit: Payer: Self-pay

## 2017-10-03 ENCOUNTER — Emergency Department: Payer: Self-pay

## 2017-10-03 ENCOUNTER — Emergency Department
Admission: EM | Admit: 2017-10-03 | Discharge: 2017-10-03 | Disposition: A | Discharge status: Home / Self Care | Payer: Self-pay | Attending: Emergency Medicine | Admitting: Emergency Medicine

## 2017-10-03 ENCOUNTER — Encounter: Payer: Self-pay | Admitting: Emergency Medicine

## 2017-10-03 DIAGNOSIS — T5991XA Toxic effect of unspecified gases, fumes and vapors, accidental (unintentional), initial encounter: Secondary | ICD-10-CM | POA: Insufficient documentation

## 2017-10-03 MED ORDER — SPACER/AERO-HOLD CHAMBER BAGS MISC: 1.0000 | 0 refills | Status: DC | PRN

## 2017-10-03 MED ORDER — IPRATROPIUM-ALBUTEROL 0.5-2.5 (3) MG/3ML IN SOLN
3.0000 mL | Freq: Once | RESPIRATORY_TRACT | Status: AC
  Administered 2017-10-03: 3 mL via RESPIRATORY_TRACT
  Filled 2017-10-03: qty 3

## 2017-10-03 MED ORDER — ALBUTEROL SULFATE HFA 108 (90 BASE) MCG/ACT IN AERS: 2.0000 | INHALATION_SPRAY | Freq: Four times a day (QID) | RESPIRATORY_TRACT | 0 refills | Status: DC | PRN

## 2017-10-03 MED ORDER — DEXAMETHASONE 4 MG PO TABS
12.0000 mg | ORAL_TABLET | Freq: Once | ORAL | Status: AC
Start: 2017-10-03 — End: 2017-10-03
  Administered 2017-10-03: 12 mg via ORAL
  Filled 2017-10-03: qty 3

## 2017-10-03 NOTE — Discharge Instructions (Addendum)
Please never mix ammonia and bleach again - when they're mixed together they make chloramine gas which is toxic and can kill you.  Use your inhaler as needed for shortness of breath and establish care with a PMD within 1 week for a recheck.  Return to the ED sooner for any concerns.  It was a pleasure to take care of you today, and thank you for coming to our emergency department.  If you have any questions or concerns before leaving please ask the nurse to grab me and I'm more than happy to go through your aftercare instructions again.  If you have any concerns once you are home that you are not improving or are in fact getting worse before you can make it to your follow-up appointment, please do not hesitate to call 911 and come back for further evaluation.  Merrily BrittleNeil Lavi Sheehan, MD  Unfortunately prescriptions medications can be very expensive.  Please consider [facility] as they have a number of medications that are $4 for a 30 day supply.  Https://i.walmartimages.com/i/if/[email or web address]/genericdruglist.pdf  Another great option is www.goodrx.com which can help you find the most affordable prices around you.  Results for orders placed or performed during the hospital encounter of 06/14/17  Group A Strep by PCR  Result Value Ref Range   Group A Strep by PCR DETECTED (A) NOT DETECTED   Dg Chest 2 View  Result Date: 10/03/2017 CLINICAL DATA:  Patient says around 8pm tonight she was cleaning with ammonia and bleach; feeling short of breath, has a cough and vomited once with coughing spell; feeling tightness in her chest across the top EXAM: CHEST - 2 VIEW COMPARISON:  None. FINDINGS: The heart size and mediastinal contours are within normal limits. Both lungs are clear. The visualized skeletal structures are unremarkable. IMPRESSION: No active cardiopulmonary disease. Electronically Signed   By: Burman NievesWilliam  Stevens M.D.   On: 10/03/2017 04:38

## 2017-10-03 NOTE — ED Triage Notes (Addendum)
Pt says around 8pm tonight she was cleaning with ammonia and bleach; feeling short of breath, has a cough and vomited once with coughing spell; feeling tightness in her chest across the top; pt talking in complete coherent sentences; texting on phone during triage; lungs clear

## 2017-10-03 NOTE — ED Provider Notes (Addendum)
[facility] Emergency Department Provider Note  ____________________________________________   First MD Initiated Contact with Patient 10/03/17 [phone]     (approximate)  I have reviewed the triage vital signs and the nursing notes.   HISTORY  Chief Complaint Cough and Shortness of Breath   HPI Heather Barron is a 22 y.o. female who presents to the emergency department with sudden onset severe cough and shortness of breath that began at 1030 last night roughly 6 hours ago.  It began while she was at home cleaning her bathroom using both ammonia and bleach.  She smelled a foul smell and inhaled several deep breaths and then began to cough and her nose began to run.  She is felt persistent shortness of breath that has had a dry cough ever since.  Her chest pain is sharp and burning upper chest worse with coughing and improved when not.    History reviewed. No pertinent past medical history.  There are no active problems to display for this patient.   History reviewed. No pertinent surgical history.  Prior to Admission medications   Medication Sig Start Date End Date Taking? Authorizing Provider  albuterol (PROVENTIL HFA;VENTOLIN HFA) 108 (90 Base) MCG/ACT inhaler Inhale 2 puffs into the lungs every 6 (six) hours as needed for wheezing or shortness of breath. 10/03/17   Merrily Brittleifenbark, Zevin Nevares, MD  Spacer/Aero-Hold Chamber Bags MISC 1 each by Does not apply route every 4 (four) hours as needed (wheezing or sob). 10/03/17   Merrily Brittleifenbark, Dare Spillman, MD    Allergies Patient has no known allergies.  History reviewed. No pertinent family history.  Social History Social History   Tobacco Use  . Smoking status: Never Smoker  . Smokeless tobacco: Never Used  Substance Use Topics  . Alcohol use: No  . Drug use: No    Review of Systems Constitutional: No fever/chills ENT: Positive for sore throat. Cardiovascular: Positive for chest pain. Respiratory: Positive for  shortness of breath. Gastrointestinal: No abdominal pain.  Positive for nausea negative for vomiting Neurological: Negative for headaches   ____________________________________________   PHYSICAL EXAM:  VITAL SIGNS: ED Triage Vitals  Enc Vitals Group     BP 10/03/17 0302 (!) 156/80     Pulse Rate 10/03/17 0302 87     Resp 10/03/17 0302 20     Temp 10/03/17 0302 98.5 F (36.9 C)     Temp Source 10/03/17 0302 Oral     SpO2 10/03/17 0302 99 %     Weight 10/03/17 0303 230 lb (104.3 kg)     Height 10/03/17 0303 5\' 4"  (1.626 m)     Head Circumference --      Peak Flow --      Pain Score 10/03/17 0303 8     Pain Loc --      Pain Edu? --      Excl. in GC? --     Constitutional: Alert and oriented x4 somewhat uncomfortable appearing but nontoxic no diaphoresis Head: Atraumatic. Nose: No congestion/rhinnorhea. Mouth/Throat: No trismus uvula midline no pharyngeal erythema or exudate Neck: No stridor.   Cardiovascular: Regular rate and rhythm Respiratory: Normal respiratory effort.  No retractions. Gastrointestinal: Mild expiratory wheeze in all fields although moving good air Neurologic:  Normal speech and language. No gross focal neurologic deficits are appreciated.  Skin:  Skin is warm, dry and intact. No rash noted.    ____________________________________________  LABS (all labs ordered are listed, but only abnormal results are displayed)  Labs  Reviewed - No data to display   __________________________________________  EKG   ____________________________________________  RADIOLOGY  Chest x-ray reviewed by me with no evidence of pneumonitis ____________________________________________   DIFFERENTIAL includes but not limited to  Chemical pneumonitis, pneumothorax, asthma   PROCEDURES  Procedure(s) performed: no  Procedures  Critical Care performed: no  ____________________________________________   INITIAL IMPRESSION / ASSESSMENT AND PLAN / ED  COURSE  Pertinent labs & imaging results that were available during my care of the patient were reviewed by me and considered in my medical decision making (see chart for details).   As part of my medical decision making, I reviewed the following data within the electronic MEDICAL RECORD NUMBER History obtained from family if available, nursing notes, old chart and ekg, as well as notes from prior ED visits.  The patient is saturating 99% on room air although with a slight wheeze.  Her symptoms are consistent with chemical pneumonitis.  As she has a slight wheeze of given her breathing treatment and a dose of dexamethasone.  Chest x-ray with no infiltrate.  I discussed the case with [facility] control who feels that as there is no infiltrate on the x-ray and the patient is saturating well she is medically stable for outpatient management.  I will give her albuterol for home.      ____________________________________________   FINAL CLINICAL IMPRESSION(S) / ED DIAGNOSES  Final diagnoses:  Toxic inhalation injury, accidental or unintentional, initial encounter      NEW MEDICATIONS STARTED DURING THIS VISIT:  New Prescriptions   ALBUTEROL (PROVENTIL HFA;VENTOLIN HFA) 108 (90 BASE) MCG/ACT INHALER    Inhale 2 puffs into the lungs every 6 (six) hours as needed for wheezing or shortness of breath.   SPACER/AERO-HOLD CHAMBER BAGS MISC    1 each by Does not apply route every 4 (four) hours as needed (wheezing or sob).     Note:  This document was prepared using Dragon voice recognition software and may include unintentional dictation errors.      Merrily Brittle, MD 10/03/17 1610    Merrily Brittle, MD 10/03/17 [phone]

## 2018-07-25 ENCOUNTER — Ambulatory Visit: Payer: Self-pay

## 2018-10-10 ENCOUNTER — Telehealth: Payer: Self-pay

## 2018-10-10 ENCOUNTER — Ambulatory Visit: Payer: Self-pay

## 2018-10-10 NOTE — Telephone Encounter (Signed)
TC from patient.  Appt scheduled for today 10/10/18 for repeat pap Aileen Fass, RN

## 2018-11-23 ENCOUNTER — Ambulatory Visit: Payer: Self-pay

## 2019-06-05 ENCOUNTER — Ambulatory Visit: Payer: Self-pay

## 2019-06-27 ENCOUNTER — Ambulatory Visit (LOCAL_COMMUNITY_HEALTH_CENTER): Payer: Self-pay | Admitting: Advanced Practice Midwife

## 2019-06-27 ENCOUNTER — Other Ambulatory Visit: Payer: Self-pay

## 2019-06-27 ENCOUNTER — Encounter: Payer: Self-pay | Admitting: Advanced Practice Midwife

## 2019-06-27 VITALS — BP 131/77 | Ht 65.0 in | Wt 389.4 lb

## 2019-06-27 DIAGNOSIS — F32A Depression, unspecified: Secondary | ICD-10-CM | POA: Insufficient documentation

## 2019-06-27 DIAGNOSIS — R87612 Low grade squamous intraepithelial lesion on cytologic smear of cervix (LGSIL): Secondary | ICD-10-CM

## 2019-06-27 DIAGNOSIS — L732 Hidradenitis suppurativa: Secondary | ICD-10-CM

## 2019-06-27 DIAGNOSIS — F17200 Nicotine dependence, unspecified, uncomplicated: Secondary | ICD-10-CM

## 2019-06-27 DIAGNOSIS — Z3009 Encounter for other general counseling and advice on contraception: Secondary | ICD-10-CM

## 2019-06-27 DIAGNOSIS — R87619 Unspecified abnormal cytological findings in specimens from cervix uteri: Secondary | ICD-10-CM | POA: Insufficient documentation

## 2019-06-27 DIAGNOSIS — E669 Obesity, unspecified: Secondary | ICD-10-CM

## 2019-06-27 DIAGNOSIS — J45909 Unspecified asthma, uncomplicated: Secondary | ICD-10-CM | POA: Insufficient documentation

## 2019-06-27 LAB — WET PREP FOR TRICH, YEAST, CLUE
Trichomonas Exam: NEGATIVE
Yeast Exam: NEGATIVE

## 2019-06-27 MED ORDER — MULTIVITAMINS PO CAPS: 1.0000 | ORAL_CAPSULE | Freq: Every day | ORAL | 0 refills | Status: DC

## 2019-06-27 NOTE — Progress Notes (Signed)
Pt not interested in any form of birth control, desires pregnancy. Pt knows she can RTC if she changes mind about birth control. Condoms declined. MVI and [employer] booklet provided.

## 2019-06-27 NOTE — Progress Notes (Signed)
Wet mount reviewed by provider, no tx. LCSW business card provided. Pt unable to have blood drawn today, lab closed at 5:00 pm. Provider orders completed.

## 2019-06-27 NOTE — Progress Notes (Signed)
Family Planning Visit- Initial Visit  Subjective:  Heather Barron is a 24 y.o. SBF smoker [id number]   being seen today for an initial well woman visit and to discuss conceiving.  She is currently using nothing for pregnancy prevention x1.5 years.. Patient reports she does want a pregnancy in the next year.  Patient has the following medical conditions has Obesity 389 lbs. BMI=64.8; Depression; Hidradenitis suppurativa; and History of abnormal pap LSIL 08/24/2017 with no f/u on their problem list.  Chief Complaint  Patient presents with  . Annual Exam    Patient reports LMP 06/01/19.  Last sex 06/23/19 without condom; 1 partner x 1.5 years.  Last cigar 2 wks ago. 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  Living with boyfriend.  Last ETOH 05/2019 (1 [place]) 1x/mo. Last pap LSIL 08/24/17 with no f/u  Patient denies MJ Body mass index is 64.8 kg/m. - Patient is eligible for diabetes screening based on BMI and age >66?  not applicable HA1C ordered? not applicable  Patient reports 1 of partners in last year. Desires STI screening?  Yes  Has patient been screened once for HCV in the past?  No  No results found for: HCVAB  Does the patient have current of drug use, have a partner with drug use, and/or has been incarcerated since last result? No  If yes-- Screen for HCV through [facility]   Does the patient meet criteria for HBV testing? No  Criteria:  -Household, sexual or needle sharing contact with HBV -History of drug use -HIV positive -Those with known Hep C   Health Maintenance Due  Topic Date Due  . Hepatitis C Screening  Never done  . COVID-19 Vaccine (1) Never done  . HIV Screening  Never done  . TETANUS/TDAP  Never done  . PAP-Cervical Cytology Screening  Never done  . PAP SMEAR-Modifier  Never done    Review of Systems  All other systems reviewed and are negative.   The following portions of the patient's history were  reviewed and updated as appropriate: allergies, current medications, past family history, past medical history, past social history, past surgical history and problem list. Problem list updated.   See flowsheet for other program required questions.  Objective:   Vitals:   06/27/19 1601  BP: 131/77  Weight: (!) 389 lb 6.4 oz (176.6 kg)  Height: 5\' 5"  (1.651 m)    Physical Exam Constitutional:      Appearance: Normal appearance. She is obese.  HENT:     Head: Normocephalic and atraumatic.     Mouth/Throat:     Mouth: Mucous membranes are moist.  Eyes:     Conjunctiva/sclera: Conjunctivae normal.  Cardiovascular:     Rate and Rhythm: Normal rate and regular rhythm.  Pulmonary:     Effort: Pulmonary effort is normal.     Breath sounds: Normal breath sounds.  Chest:     Breasts:        Right: Normal.        Left: Normal.     Comments: Hidradenitis supperitiva on breasts, thighs Abdominal:     Palpations: Abdomen is soft.     Comments: Poor tone, soft without tenderness, increased adipose  Genitourinary:    General: Normal vulva.     Exam position: Lithotomy position.     Vagina: Vaginal discharge (white creamy leukorrhea with malodor, ph<4.5) present.     Rectum: Normal.  Comments: Increased adipose and difficult exam--unable to adequately visualize cervix for pap, unable to palpate uterus or ovaries on bimanual exam due to increased adipose and pt unable to open legs wide enough to visualize Musculoskeletal:        General: Normal range of motion.     Cervical back: Normal range of motion and neck supple.  Skin:    General: Skin is warm and dry.     Comments: Feet white and dry with scaling--using clorox on feet and hidradenitis suppuritiva--counseled to stop using  Neurological:     Mental Status: She is alert.  Psychiatric:        Mood and Affect: Mood normal.       Assessment and Plan:  Heather Barron is a 24 y.o. female presenting to the [facility] Department for an initial well woman exam/family planning visit  Contraception counseling: Reviewed all forms of birth control options in the tiered based approach. available including abstinence; over the counter/barrier methods; hormonal contraceptive medication including pill, patch, ring, injection,contraceptive implant, ECP; hormonal and nonhormonal IUDs; permanent sterilization options including vasectomy and the various tubal sterilization modalities. Risks, benefits, and typical effectiveness rates were reviewed.  Questions were answered.  Written information was also given to the patient to review.  Patient desires nothing, this was prescribed for patient. She will follow up in  prn for surveillance.  She was told to call with any further questions, or with any concerns about this method of contraception.  Emphasized use of condoms 100% of the time for STI prevention.  Patient was offered ECP. ECP was not accepted by the patient. ECP counseling was not given - see RN documentation  1. Family planning services Treat wet mount per standing orders  Immunization nurse consult Counseled pt on Hidradenitis suppurativa, preconceptual counseling done to couple, referrals to [place] Ferguson, LCSW and primary care MD/OB/GYN - WET PREP FOR [place], YEAST, [facility] - Pap IG (Image Guided) - Ambulatory referral to [facility]  2. Obesity, unspecified classification, unspecified obesity type, unspecified whether serious comorbidity present   3. Hidradenitis suppurativa Counseled to see derm or primary care MD.  Counseled to use Hibiclens or Dial soap  4. Low grade squamous intraepithelial lesion on cytologic smear of cervix (LGSIL) Difficulty obtaining pap due to morbid obesity     Return in about 1 year (around 06/26/2020) for physical.  No future appointments.  Heather Barron, CNM

## 2019-06-29 LAB — PAP IG (IMAGE GUIDED): PAP Smear Comment: 0

## 2019-07-05 ENCOUNTER — Telehealth: Payer: Self-pay

## 2019-07-05 ENCOUNTER — Telehealth: Payer: Self-pay | Admitting: Family Medicine

## 2019-07-05 NOTE — Telephone Encounter (Signed)
Return call to patient.  Left a message that we could have her medical records ready for pick up if she could let us know when she was coming and it would save her some time.  I also let her know the first 10 pages were at no cost to her , but pages 11 and greater were 5 cents a page and she would need to sign an Release of Information form on arrival.  There are a total of 33 pages.

## 2019-07-05 NOTE — Telephone Encounter (Signed)
TC today by patient to discuss her abnormal PAP results and receiving a PAP card in the mail.  She received a PAP card in the mail and did not want mail received from [facility].  I apologized to her for mailing the PAP card and explained to her I was just returning to my role and also just learning Epic EMR.  In learning Epic, I did not realize she did not want mail sent to her home.  I did explain to her that I was going to seek out training on where to look for that particular information so that does not happen again.  Patient was perfectly fine with it and stated "my Mom and I are best friends, but I like finding out my results before my Mom".  "She goes thru my mail".  I again apologized to her for my mistake. Patient also inquired about going to [facility] for her care.  She and her boyfriend are trying to conceive.  She would like to see a Provider to discuss her abnormal PAP and conceiving concerns.  She is thinking it would be better to go to the same practice for both.  She would like to come by and pick up her medical records in person.  I told her I would check to see how we were handling medical records currently and I would call her right back.   Confidential Patient Alert has been added to patient chart for "No Mail" and yellow sticky note used for "No Mail / Phone Call Only".

## 2019-07-05 NOTE — Telephone Encounter (Signed)
Patient said she informed the nurses at her last visit that she didn't want the pap results to be sent to her address only by calling the patient.

## 2019-07-16 ENCOUNTER — Encounter: Payer: Self-pay | Admitting: Podiatry

## 2019-07-16 ENCOUNTER — Other Ambulatory Visit: Payer: Self-pay

## 2019-07-16 ENCOUNTER — Ambulatory Visit: Payer: BC Managed Care – PPO | Admitting: Podiatry

## 2019-07-16 VITALS — Temp 96.6°F

## 2019-07-16 DIAGNOSIS — S99922A Unspecified injury of left foot, initial encounter: Secondary | ICD-10-CM | POA: Diagnosis not present

## 2019-07-16 NOTE — Progress Notes (Signed)
  Subjective:  Patient ID: Heather Barron, female    DOB: [birth year],  MRN: [id number]  Chief Complaint  Patient presents with  . Nail Problem    L hallux. Pt stated, "My nail was starting to lift up 4 months ago, so I had an artificial nail placed over it. I hurried out of bed this morning and the whole nail lifted up. 10/10 pain and a lot of bleeding. No pus".    24 y.o. female presents with the above complaint. History confirmed with patient.  Her left hallux nail is very painful.  The right toenail is also becoming loose but is not nearly as loose and she has not injured this.  She would like the left hallux nail removed today.  She states that she pulled it on something and did not actually drop anything on this or suffer a crush injury  Objective:  Physical Exam: warm, good capillary refill, no trophic changes or ulcerative lesions, normal DP and PT pulses and normal sensory exam.  Painful partial traumatic avulsion of the left hallux nail, there is subungual hematoma involving her percent of the nail plate, dried blood visible in the hyponychium, nail is very loose.  Assessment:   1. Injury of toenail of left foot, initial encounter      Plan:  Patient was evaluated and treated and all questions answered.  Partial traumatic avulsion of Nail, left -Patient elects to proceed with complete left hallux toenail removal today -Surgical shoe dispensed for comfort and support. -Left hallux nail excised. See procedure note. -Educated on post-procedure care including soaking. Written instructions provided. -She will follow-up as needed, or if the right hallux nail becomes loose and that she would like this removed as well.  Procedure: Avulsion of toenail Location: Left 1st toe  Anesthesia: Lidocaine 1% plain; 1.5 mL and Marcaine 0.5% plain; 1.5 mL, digital block. Skin Prep: Betadine. Dressing: Silvadene; telfa; dry, sterile, compression dressing. Technique: Following skin prep,  the toe was exsanguinated and a tourniquet was secured at the base of the toe. The nail was freed and avulsed with a hemostat.  No nail bed laceration was noted.  The area was cleansed. The tourniquet was then removed and sterile dressing applied.  Disposition: Patient tolerated procedure well.   Return if symptoms worsen or fail to improve.

## 2019-07-16 NOTE — Patient Instructions (Signed)

## 2019-07-17 ENCOUNTER — Telehealth: Payer: Self-pay

## 2019-07-17 NOTE — Telephone Encounter (Signed)
Erroneous encounter

## 2019-10-26 ENCOUNTER — Ambulatory Visit: Payer: Self-pay | Admitting: Family Medicine

## 2019-12-30 ENCOUNTER — Emergency Department (HOSPITAL_COMMUNITY)
Admission: EM | Admit: 2019-12-30 | Discharge: 2019-12-30 | Disposition: A | Discharge status: Home / Self Care | Payer: BC Managed Care – PPO | Attending: Emergency Medicine | Admitting: Emergency Medicine

## 2019-12-30 ENCOUNTER — Other Ambulatory Visit: Payer: Self-pay

## 2019-12-30 ENCOUNTER — Emergency Department (HOSPITAL_COMMUNITY): Payer: BC Managed Care – PPO

## 2019-12-30 ENCOUNTER — Encounter (HOSPITAL_COMMUNITY): Payer: Self-pay

## 2019-12-30 DIAGNOSIS — R609 Edema, unspecified: Secondary | ICD-10-CM | POA: Diagnosis not present

## 2019-12-30 DIAGNOSIS — M25571 Pain in right ankle and joints of right foot: Secondary | ICD-10-CM | POA: Insufficient documentation

## 2019-12-30 DIAGNOSIS — X501XXA Overexertion from prolonged static or awkward postures, initial encounter: Secondary | ICD-10-CM | POA: Insufficient documentation

## 2019-12-30 DIAGNOSIS — J45909 Unspecified asthma, uncomplicated: Secondary | ICD-10-CM | POA: Diagnosis not present

## 2019-12-30 DIAGNOSIS — F17290 Nicotine dependence, other tobacco product, uncomplicated: Secondary | ICD-10-CM | POA: Insufficient documentation

## 2019-12-30 LAB — POC URINE PREG, ED: Preg Test, Ur: NEGATIVE

## 2019-12-30 MED ORDER — IBUPROFEN 800 MG PO TABS: 800.0000 mg | ORAL_TABLET | Freq: Three times a day (TID) | ORAL | 0 refills | Status: DC

## 2019-12-30 MED ORDER — KETOROLAC TROMETHAMINE 60 MG/2ML IM SOLN
60.0000 mg | Freq: Once | INTRAMUSCULAR | Status: AC
  Administered 2019-12-30: 60 mg via INTRAMUSCULAR
  Filled 2019-12-30: qty 2

## 2019-12-30 NOTE — ED Provider Notes (Signed)
Heather Barron Provider Note   CSN: [id number] Arrival date & time: 12/30/19  6294     History Chief Complaint  Patient presents with  . Ankle Pain    Heather Barron is a 24 y.o. female.  HPI 25 year old female with a history of depression, obesity, asthma presents to the ER with complaints of right ankle pain.  She was stepping out of her car this morning and twisted her ankle the wrong way.  She stated she heard a crack or pop and had immediate onset of pain.  Has had difficulty bearing weight since secondary to the pain.  Denies any numbness or tingling.  Denies any falls.    Past Medical History:  Diagnosis Date  . Patient denies medical problems     Patient Active Problem List   Diagnosis Date Noted  . Obesity 389 lbs. BMI=64.8 06/27/2019  . Depression 06/27/2019  . Hidradenitis suppurativa 06/27/2019  . History of abnormal pap LSIL 08/24/2017 with no f/u 06/27/2019  . Smoker cigars 06/27/2019  . Asthma 06/27/2019    Past Surgical History:  Procedure Laterality Date  . denies       OB History    Gravida  0   Para  0   Term  0   Preterm  0   AB  0   Living  0     SAB  0   IAB  0   Ectopic  0   Multiple  0   Live Births  0           Family History  Problem Relation Age of Onset  . Pancreatic cancer Maternal Grandmother   . Diabetes Mother   . Ovarian cysts Sister     Social History   Tobacco Use  . Smoking status: Never Smoker  . Smokeless tobacco: Never Used  Vaping Use  . Vaping Use: Never used  Substance Use Topics  . Alcohol use: Yes    Comment: occasionally  . Drug use: Never    Home Medications Prior to Admission medications   Medication Sig Start Date End Date Taking? Authorizing Provider  albuterol (PROVENTIL HFA;VENTOLIN HFA) 108 (90 Base) MCG/ACT inhaler Inhale 2 puffs into the lungs every 6 (six) hours as needed for wheezing or shortness of breath. 10/03/17   Merrily Brittle, MD   ibuprofen (ADVIL) 800 MG tablet Take 1 tablet (800 mg total) by mouth 3 (three) times daily. 12/30/19   Mare Ferrari, PA-C  Multiple Vitamin (MULTIVITAMIN) capsule Take 1 capsule by mouth daily. 06/27/19   Federico Flake, MD  Spacer/Aero-Hold Chamber Bags MISC 1 each by Does not apply route every 4 (four) hours as needed (wheezing or sob). 10/03/17   Merrily Brittle, MD    Allergies    Patient has no known allergies.  Review of Systems   Review of Systems  Musculoskeletal: Positive for arthralgias, gait problem and joint swelling. Negative for back pain, neck pain and neck stiffness.  Neurological: Negative for weakness and numbness.    Physical Exam Updated Vital Signs BP (!) 121/93   Pulse 89   Temp 98.2 F (36.8 C) (Oral)   Resp (!) 23   Ht 5\' 5"  (1.651 m)   Wt (!) 176.4 kg   LMP 11/26/2019   SpO2 96%   BMI 64.73 kg/m   Physical Exam Vitals reviewed.  Constitutional:      General: She is not in acute distress.    Appearance: Normal appearance.  She is obese. She is not ill-appearing, toxic-appearing or diaphoretic.  HENT:     Head: Normocephalic and atraumatic.  Eyes:     General:        Right eye: No discharge.        Left eye: No discharge.     Extraocular Movements: Extraocular movements intact.     Conjunctiva/sclera: Conjunctivae normal.  Musculoskeletal:        General: Swelling, tenderness and signs of injury present. No deformity. Normal range of motion.     Right lower leg: Edema present.     Left lower leg: No edema.     Comments: Right ankle with diffuse swelling starting from the level of the lateral malleolus to the midfoot.  2+ DP pulses intact, <2 cap refill, sensations intact. ROM limited secondary to pain, no warmth or overlying erythema. No deformities noted. Left ankle without abnormalities   Skin:    Capillary Refill: Capillary refill takes less than 2 seconds.     Findings: No bruising, erythema or rash.  Neurological:     General: No  focal deficit present.     Mental Status: She is alert and oriented to person, place, and time.     Sensory: No sensory deficit.     Motor: No weakness.  Psychiatric:        Mood and Affect: Mood normal.        Behavior: Behavior normal.     ED Results / Procedures / Treatments   Labs (all labs ordered are listed, but only abnormal results are displayed) Labs Reviewed  POC URINE PREG, ED    EKG None  Radiology DG Ankle Complete Right  Result Date: 12/30/2019 CLINICAL DATA:  Twisted ankle last evening, now with lateral sided ankle pain. EXAM: RIGHT ANKLE - COMPLETE 3+ VIEW COMPARISON:  None. FINDINGS: Diffuse soft tissue swelling about the ankle. No associated fracture or dislocation. Joint spaces are preserved. Ankle mortise is preserved. Small ankle joint effusion. Minimal enthesopathic change involving the Achilles tendon insertion site. No plantar calcaneal spur. IMPRESSION: Diffuse soft tissue swelling about the ankle with small ankle joint effusion but without associated fracture or dislocation. Electronically Signed   By: Simonne Come M.D.   On: 12/30/2019 07:30    Procedures Procedures (including critical care time)  Medications Ordered in ED Medications  ketorolac (TORADOL) injection 60 mg (60 mg Intramuscular Given 12/30/19 1007)    ED Course  I have reviewed the triage vital signs and the nursing notes.  Pertinent labs & imaging results that were available during my care of the patient were reviewed by me and considered in my medical decision making (see chart for details).    MDM Rules/Calculators/A&P                         24 year old female presents with right ankle pain after twisting it getting out of her car.  Right ankle with diffuse swelling, neurovascularly intact.  Plain films without evidence of fractures or dislocations.  Low suspicion for septic joint as there is no overlying erythema or warmth.  Patient was educated on RICE protocol, will apply Ace  wrap, cam walker, crutches.  Will refer to Ortho.  Return precautions discussed.  She voiced understanding and is agreeable  Final Clinical Impression(s) / ED Diagnoses Final diagnoses:  Acute right ankle pain    Rx / DC Orders ED Discharge Orders         Ordered  ibuprofen (ADVIL) 800 MG tablet  3 times daily        12/30/19 0958           Mare Ferrari, PA-C 12/30/19 1038    Glynn Octave, MD 12/30/19 2027

## 2019-12-30 NOTE — Discharge Instructions (Addendum)
Apply a compressive ACE bandage. Rest and elevate the affected painful area.  Apply cold compresses intermittently as needed.  Take 800mg  of Ibuprofen up to 3 times daily as needed for pain. As pain recedes, begin normal activities slowly as tolerated. Please follow up with the orthopedic doctor whose contact information I have provided in your discharge paperwork.  Return to the ER if you experience worsening pain, fevers, chills

## 2019-12-30 NOTE — ED Triage Notes (Signed)
Pt reports twisting right ankle pain after getting out of his car.

## 2020-03-27 ENCOUNTER — Encounter: Payer: Self-pay | Admitting: Obstetrics and Gynecology

## 2020-04-03 ENCOUNTER — Encounter: Payer: Self-pay | Admitting: Obstetrics and Gynecology

## 2020-06-26 ENCOUNTER — Telehealth: Payer: Self-pay | Admitting: Nurse Practitioner

## 2020-06-26 NOTE — Telephone Encounter (Signed)
Telephone call to patient regarding PAP follow up.  No answer, left message to call. Danni Leabo, RN  

## 2020-07-28 ENCOUNTER — Telehealth: Payer: Self-pay | Admitting: Nurse Practitioner

## 2020-07-28 NOTE — Telephone Encounter (Signed)
Telephone call to patient regarding PAP follow up.  Patient states she is a currently pregnant and is receiving care from [facility].  Patient states that she is uncertain if she received a PAP during her OB appointment.  Patient to call office to see if PAP is completed, and would call back.  Glenna Fellows, RN

## 2020-07-31 ENCOUNTER — Other Ambulatory Visit: Payer: Self-pay | Admitting: Obstetrics and Gynecology

## 2020-07-31 DIAGNOSIS — O99212 Obesity complicating pregnancy, second trimester: Secondary | ICD-10-CM

## 2020-07-31 DIAGNOSIS — Z363 Encounter for antenatal screening for malformations: Secondary | ICD-10-CM

## 2020-08-04 ENCOUNTER — Other Ambulatory Visit: Payer: Self-pay

## 2020-08-18 ENCOUNTER — Encounter: Payer: Self-pay | Admitting: *Deleted

## 2020-08-21 ENCOUNTER — Other Ambulatory Visit: Payer: Self-pay | Admitting: *Deleted

## 2020-08-21 ENCOUNTER — Ambulatory Visit: Payer: BC Managed Care – PPO | Attending: Obstetrics and Gynecology

## 2020-08-21 ENCOUNTER — Ambulatory Visit: Payer: BC Managed Care – PPO | Admitting: *Deleted

## 2020-08-21 ENCOUNTER — Other Ambulatory Visit: Payer: Self-pay

## 2020-08-21 DIAGNOSIS — O99212 Obesity complicating pregnancy, second trimester: Secondary | ICD-10-CM | POA: Diagnosis not present

## 2020-08-21 DIAGNOSIS — Z363 Encounter for antenatal screening for malformations: Secondary | ICD-10-CM | POA: Diagnosis present

## 2020-09-18 ENCOUNTER — Ambulatory Visit: Payer: BC Managed Care – PPO | Attending: Obstetrics and Gynecology

## 2020-09-18 ENCOUNTER — Encounter: Payer: Self-pay | Admitting: *Deleted

## 2020-09-18 ENCOUNTER — Ambulatory Visit: Payer: BC Managed Care – PPO | Admitting: *Deleted

## 2020-09-18 ENCOUNTER — Other Ambulatory Visit: Payer: Self-pay

## 2020-09-18 DIAGNOSIS — O99213 Obesity complicating pregnancy, third trimester: Secondary | ICD-10-CM

## 2020-09-18 DIAGNOSIS — Z362 Encounter for other antenatal screening follow-up: Secondary | ICD-10-CM

## 2020-09-18 DIAGNOSIS — Z3A30 30 weeks gestation of pregnancy: Secondary | ICD-10-CM

## 2020-10-15 ENCOUNTER — Inpatient Hospital Stay (HOSPITAL_COMMUNITY)
Admission: AD | Admit: 2020-10-15 | Discharge: 2020-10-16 | DRG: 833 | Disposition: A | Discharge status: Other Acute Inpatient Hospital | Payer: BC Managed Care – PPO | Attending: Obstetrics & Gynecology | Admitting: Obstetrics & Gynecology

## 2020-10-15 ENCOUNTER — Encounter (HOSPITAL_COMMUNITY): Payer: Self-pay | Admitting: Obstetrics & Gynecology

## 2020-10-15 ENCOUNTER — Other Ambulatory Visit: Payer: Self-pay

## 2020-10-15 DIAGNOSIS — O99213 Obesity complicating pregnancy, third trimester: Secondary | ICD-10-CM | POA: Diagnosis present

## 2020-10-15 DIAGNOSIS — O1413 Severe pre-eclampsia, third trimester: Secondary | ICD-10-CM | POA: Diagnosis present

## 2020-10-15 DIAGNOSIS — Z3A34 34 weeks gestation of pregnancy: Secondary | ICD-10-CM

## 2020-10-15 DIAGNOSIS — Z8616 Personal history of COVID-19: Secondary | ICD-10-CM | POA: Diagnosis not present

## 2020-10-15 DIAGNOSIS — O99820 Streptococcus B carrier state complicating pregnancy: Secondary | ICD-10-CM | POA: Diagnosis present

## 2020-10-15 LAB — URINALYSIS, ROUTINE W REFLEX MICROSCOPIC
Bacteria, UA: NONE SEEN
Bilirubin Urine: NEGATIVE
Glucose, UA: NEGATIVE mg/dL
Hgb urine dipstick: NEGATIVE
Ketones, ur: NEGATIVE mg/dL
Leukocytes,Ua: NEGATIVE
Nitrite: NEGATIVE
Protein, ur: 100 mg/dL — AB
Specific Gravity, Urine: 1.017 (ref 1.005–1.030)
pH: 6 (ref 5.0–8.0)

## 2020-10-15 LAB — COMPREHENSIVE METABOLIC PANEL
ALT: 36 U/L (ref 0–44)
AST: 60 U/L — ABNORMAL HIGH (ref 15–41)
Albumin: 2.4 g/dL — ABNORMAL LOW (ref 3.5–5.0)
Alkaline Phosphatase: 128 U/L — ABNORMAL HIGH (ref 38–126)
Anion gap: 9 (ref 5–15)
BUN: 8 mg/dL (ref 6–20)
CO2: 22 mmol/L (ref 22–32)
Calcium: 8.7 mg/dL — ABNORMAL LOW (ref 8.9–10.3)
Chloride: 102 mmol/L (ref 98–111)
Creatinine, Ser: 0.68 mg/dL (ref 0.44–1.00)
GFR, Estimated: >60 mL/min (ref >60)
Glucose, Bld: 74 mg/dL (ref 70–99)
Potassium: 3.8 mmol/L (ref 3.5–5.1)
Sodium: 133 mmol/L — ABNORMAL LOW (ref 135–145)
Total Bilirubin: 0.3 mg/dL (ref 0.3–1.2)
Total Protein: 6.7 g/dL (ref 6.5–8.1)

## 2020-10-15 LAB — CBC
HCT: 36.3 % (ref 36.0–46.0)
Hemoglobin: 11 g/dL — ABNORMAL LOW (ref 12.0–15.0)
MCH: 25.4 pg — ABNORMAL LOW (ref 26.0–34.0)
MCHC: 30.3 g/dL (ref 30.0–36.0)
MCV: 83.8 fL (ref 80.0–100.0)
Platelets: 160 10*3/uL (ref 150–400)
RBC: 4.33 MIL/uL (ref 3.87–5.11)
RDW: 15.3 % (ref 11.5–15.5)
WBC: 8.2 10*3/uL (ref 4.0–10.5)
nRBC: 0 % (ref 0.0–0.2)

## 2020-10-15 LAB — TYPE AND SCREEN
ABO/RH(D): B POS
Antibody Screen: NEGATIVE

## 2020-10-15 LAB — PROTEIN / CREATININE RATIO, URINE
Creatinine, Urine: 132.33 mg/dL
Protein Creatinine Ratio: 4.04 mg/mg{Cre} — ABNORMAL HIGH (ref 0.00–0.15)
Total Protein, Urine: 535 mg/dL

## 2020-10-15 MED ORDER — NIFEDIPINE ER OSMOTIC RELEASE 30 MG PO TB24
30.0000 mg | ORAL_TABLET | Freq: Two times a day (BID) | ORAL | Status: DC
  Administered 2020-10-15: 30 mg via ORAL
  Filled 2020-10-15: qty 1

## 2020-10-15 MED ORDER — ACETAMINOPHEN 325 MG PO TABS: 650.0000 mg | ORAL_TABLET | ORAL | Status: DC | PRN

## 2020-10-15 MED ORDER — CALCIUM CARBONATE ANTACID 500 MG PO CHEW: 2.0000 | CHEWABLE_TABLET | ORAL | Status: DC | PRN

## 2020-10-15 MED ORDER — LACTATED RINGERS IV SOLN: INTRAVENOUS | Status: DC

## 2020-10-15 MED ORDER — SALINE SPRAY 0.65 % NA SOLN
1.0000 | NASAL | Status: DC | PRN
  Filled 2020-10-15: qty 44

## 2020-10-15 MED ORDER — BUTALBITAL-APAP-CAFFEINE 50-325-40 MG PO TABS
2.0000 | ORAL_TABLET | ORAL | Status: DC | PRN
  Administered 2020-10-15 – 2020-10-16 (×2): 2 via ORAL
  Filled 2020-10-15 (×2): qty 2

## 2020-10-15 MED ORDER — MAGNESIUM SULFATE 40 GM/1000ML IV SOLN
2.0000 g/h | INTRAVENOUS | Status: DC
  Administered 2020-10-15: 2 g/h via INTRAVENOUS
  Filled 2020-10-15: qty 1000

## 2020-10-15 MED ORDER — BETAMETHASONE SOD PHOS & ACET 6 (3-3) MG/ML IJ SUSP
12.0000 mg | INTRAMUSCULAR | Status: DC
  Administered 2020-10-15: 12 mg via INTRAMUSCULAR
  Filled 2020-10-15: qty 5

## 2020-10-15 MED ORDER — LABETALOL HCL 5 MG/ML IV SOLN
20.0000 mg | INTRAVENOUS | Status: DC | PRN
Start: 2020-10-15 — End: 2020-10-16
  Administered 2020-10-15 – 2020-10-16 (×3): 20 mg via INTRAVENOUS
  Filled 2020-10-15 (×3): qty 4

## 2020-10-15 MED ORDER — HYDRALAZINE HCL 20 MG/ML IJ SOLN
10.0000 mg | INTRAMUSCULAR | Status: DC | PRN
  Administered 2020-10-15: 10 mg via INTRAVENOUS
  Filled 2020-10-15 (×2): qty 1

## 2020-10-15 MED ORDER — LABETALOL HCL 5 MG/ML IV SOLN
40.0000 mg | INTRAVENOUS | Status: DC | PRN
  Administered 2020-10-15 (×2): 40 mg via INTRAVENOUS
  Filled 2020-10-15 (×2): qty 8

## 2020-10-15 MED ORDER — LABETALOL HCL 5 MG/ML IV SOLN
80.0000 mg | INTRAVENOUS | Status: DC | PRN
  Administered 2020-10-15 (×2): 80 mg via INTRAVENOUS
  Filled 2020-10-15 (×2): qty 16

## 2020-10-15 MED ORDER — PRENATAL MULTIVITAMIN CH: 1.0000 | ORAL_TABLET | Freq: Every day | ORAL | Status: DC

## 2020-10-15 MED ORDER — DOCUSATE SODIUM 100 MG PO CAPS: 100.0000 mg | ORAL_CAPSULE | Freq: Every day | ORAL | Status: DC

## 2020-10-15 MED ORDER — MAGNESIUM SULFATE BOLUS VIA INFUSION
4.0000 g | Freq: Once | INTRAVENOUS | Status: AC
  Administered 2020-10-15: 4 g via INTRAVENOUS
  Filled 2020-10-15: qty 1000

## 2020-10-15 NOTE — MAU Provider Note (Addendum)
Patient Heather Barron is 25 y.o. G1P0000  At [redacted]w[redacted]d here with complaints of HA, feeling unwell. She denies vaginal bleeding, decreased fetal movements, LOF. She endorses a HA and vomiting today; she denies blurry vision, floating spots. She denies fever, SOB, dysuria. She reports that she woke up with upper abdominal pain at 2 am and went to her OB. At her OB her BP was found to be elevated and she reported RUQ and LUQ pain. SHe was then sent to MAU for evaluation.   History     CSN: [id number]  Arrival date and time: 10/15/20 1724   None     Chief Complaint  Patient presents with   BP Evaluation   Headache  This is a new problem. The current episode started today. The problem occurs constantly. The problem has been unchanged. The pain is located in the Bilateral region. The pain does not radiate. The pain quality is not similar to prior headaches. The quality of the pain is described as stabbing. The pain is at a severity of 6/10. Associated symptoms include vomiting. Pertinent negatives include no abdominal pain, anorexia, blurred vision, dizziness, facial sweating, scalp tenderness or visual change. Nothing aggravates the symptoms. She has tried nothing for the symptoms.   OB History     Gravida  1   Para  0   Term  0   Preterm  0   AB  0   Living  0      SAB  0   IAB  0   Ectopic  0   Multiple  0   Live Births  0           Past Medical History:  Diagnosis Date   Headache    Patient denies medical problems    Sleep apnea    Vaginal Pap smear, abnormal     Past Surgical History:  Procedure Laterality Date   denies     NO PAST SURGERIES      Family History  Problem Relation Age of Onset   Diabetes Mother    Ovarian cysts Sister    Pancreatic cancer Maternal Grandmother     Social History   Tobacco Use   Smoking status: Never   Smokeless tobacco: Never  Vaping Use   Vaping Use: Never used  Substance Use Topics   Alcohol use: Yes     Comment: occasionally   Drug use: Never    Allergies: No Known Allergies  Medications Prior to Admission  Medication Sig Dispense Refill Last Dose   ferrous sulfate 324 MG TBEC Take 324 mg by mouth daily.   10/14/2020   Prenatal Vit-Fe Fumarate-FA (PRENATAL MULTIVITAMIN) TABS tablet Take 1 tablet by mouth daily at 12 noon.   10/14/2020   albuterol (PROVENTIL HFA;VENTOLIN HFA) 108 (90 Base) MCG/ACT inhaler Inhale 2 puffs into the lungs every 6 (six) hours as needed for wheezing or shortness of breath. (Patient not taking: Reported on 08/21/2020) 1 Inhaler 0    ibuprofen (ADVIL) 800 MG tablet Take 1 tablet (800 mg total) by mouth 3 (three) times daily. (Patient not taking: Reported on 08/21/2020) 21 tablet 0    Multiple Vitamin (MULTIVITAMIN) capsule Take 1 capsule by mouth daily. (Patient not taking: Reported on 08/21/2020) 100 capsule 0    Spacer/Aero-Hold Chamber Bags MISC 1 each by Does not apply route every 4 (four) hours as needed (wheezing or sob). 1 each 0     Review of Systems  Constitutional: Negative.  HENT: Negative.    Eyes:  Negative for blurred vision.  Respiratory: Negative.    Gastrointestinal:  Positive for vomiting. Negative for abdominal pain and anorexia.  Endocrine: Negative.   Genitourinary: Negative.   Neurological:  Positive for headaches. Negative for dizziness.  Physical Exam   Blood pressure (!) 143/79, pulse (!) 120, temperature 98.6 F (37 C), temperature source Oral, resp. rate 20, height 5\' 5"  (1.651 m), weight (!) 181.8 kg, last menstrual period 02/09/2020, SpO2 99 %.  Physical Exam Constitutional:      Appearance: Normal appearance.  Cardiovascular:     Rate and Rhythm: Normal rate and regular rhythm.     Pulses: Normal pulses.  Pulmonary:     Effort: Pulmonary effort is normal. No respiratory distress.     Breath sounds: Normal breath sounds.  Musculoskeletal:        General: Normal range of motion.     Right lower leg: Edema present.     Left lower  leg: Edema present.  Skin:    General: Skin is warm.     Capillary Refill: Capillary refill takes less than 2 seconds.  Neurological:     General: No focal deficit present.     Mental Status: She is alert.     Motor: No weakness.  Psychiatric:        Mood and Affect: Mood normal.  Patient does not have RUQ tenderness Eyelids appear edematous  MAU Course  Procedures  MDM -admission labs and IV started immediately; keep patient NPO for now -MgSO4 and BMZ ordered -Dr. 02/11/2020 is aware and en route to see patient; agrees with  recommendation for admission, mgso4, BMZ and close observation -NST: 145 bpm, mod var, present acel, no decels, no contractions -patient has had two doses of IV labetalol -Patient reports positive COVID test 14 days ago; is currently asymptomatic and back at work Assessment and Plan  Dr. Juliene Pina at the bedside to admit patient for pre-e with severe features  Juliene Pina 10/15/2020, 7:47 PM

## 2020-10-15 NOTE — MAU Provider Note (Signed)
Received call from MAU CNM about this pt sent from office by Dr Conni Elliot for elevated BP with no prior HTN Hx. Pt presented to office with headache.  Current diagnosis is severe Preeclampsia based on BP criteria.  34+ wks, NICU is full Defer calling neonatologist as at this point admission for maternal stabilization of BP is warranted and I'll speak with NICU team before delivery plan is made, patient is not stable for transfer to any other facility for NICU bed  Plan  In MAU start Labetalol protocol for HTN management Magnesium for seizure prevention  BTMZ for FLM  Once BP somewhat better, transfer to Antepartum/ High Risk Ob Labs are pending En route to see patient, MAU CNM aware   ---Heather Evans MD [facility]

## 2020-10-15 NOTE — H&P (Addendum)
Heather Barron is a 25 y.o. female G1, 34.3 wks by 7 wk sono (not c/w LMP dating), admitted with preeclampsia with severe range BPs and headache  AA female with morbid obesity BMI 66 and no prior HTN hx.  Spontaneous pregnancy FOB involved. Patient is a Psychologist, forensic. Presented to office today with intractable headache and report light sensitivity. No visual scotomas/ no RUQ or epigastric pain/ or SOB/ CP. +FMs. No UCs/ LOF/ VB  PNCourse Nl low risk NIPT Passed early Glucola at 16 wks as well as at 28 wks  AFP1 nl Anatomy sono limited due to BMI, saw MFM after 2 failed attempts to complete anatomy in office. Overall wnl w/ limitations. Growth AGA in 3rd trim. Last growth on 9/27 at 6 lbs 89% Vx  Patient was encouraged to start bbASA for PEC risk reduction and reminded several times but she did not take it Bouts of depression/ anxiety in pregnancy but self resolved, pt didn't want to take Zoloft that was prescribed, feels it was related to adjusting to pregnancy and with FOB, no SI/ HI  Covid 19 vaccines done but Covid 19 infection 9/20 week   OB History     Gravida  1   Para  0   Term  0   Preterm  0   AB  0   Living  0      SAB  0   IAB  0   Ectopic  0   Multiple  0   Live Births  0          Past Medical History:  Diagnosis Date   Headache    Patient denies medical problems    Sleep apnea    Vaginal Pap smear, abnormal    Past Surgical History:  Procedure Laterality Date   denies     NO PAST SURGERIES     Family History: family history includes Diabetes in her mother; Ovarian cysts in her sister; Pancreatic cancer in her maternal grandmother. Social History:  reports that she has never smoked. She has never used smokeless tobacco. She reports current alcohol use. She reports that she does not use drugs.     Maternal Diabetes: No Genetic Screening: Normal [facility] NIPT low risk Girl , AFP1 nl  Maternal Ultrasounds/Referrals: Normal and  Other:limitations due to maternal BMI Fetal Ultrasounds or other Referrals:  Referred to Materal Fetal Medicine  to complete anatomy sono Maternal Substance Abuse:  No Significant Maternal Medications:  None BTMZ 1st dose in MAU on 9/28 at 6 pm Significant Maternal Lab Results:  Other:  GBS not done  Other Comments:   severe preeclampsia   Review of Systems History   Blood pressure (!) 155/99, pulse 91, temperature 98.6 F (37 C), temperature source Oral, resp. rate 20, height 5\' 5"  (1.651 m), weight (!) 181.8 kg, last menstrual period 02/09/2020, SpO2 100 %. Exam Physical Exam  Physical exam:  A&O x 3, no acute distress. Pleasant. BMI 66 HEENT neg, no thyromegaly Lungs CTA bilat CV RRR, S1S2 normal Abdo soft, non tender, non acute. Gravid.  Extr +2edema/ no calf tenderness. DTR +1/ +1 Pelvic  FHT   Toco None  Prenatal labs: ABO, Rh: --/--/B POS (09/28 1823) Antibody: NEG (09/28 1823) Rubella:  Immune Varicella - Non immune  RPR:   Neg HBsAg:   Neg HIV:   Neg GBS:   will collect in MAU/ Ante  CBC Latest Ref Rng & Units 10/15/2020  WBC 4.0 - 10.5  K/uL 8.2  Hemoglobin 12.0 - 15.0 g/dL 11.0(L)  Hematocrit 36.0 - 46.0 % 36.3  Platelets 150 - 400 K/uL 160   CMP     Component Value Date/Time   NA 133 (L) 10/15/2020 1825   K 3.8 10/15/2020 1825   CL 102 10/15/2020 1825   CO2 22 10/15/2020 1825   GLUCOSE 74 10/15/2020 1825   BUN 8 10/15/2020 1825   CREATININE 0.68 10/15/2020 1825   CALCIUM 8.7 (L) 10/15/2020 1825   PROT 6.7 10/15/2020 1825   ALBUMIN 2.4 (L) 10/15/2020 1825   AST 60 (H) 10/15/2020 1825   ALT 36 10/15/2020 1825   ALKPHOS 128 (H) 10/15/2020 1825   BILITOT 0.3 10/15/2020 1825   GFRNONAA >60 10/15/2020 1825     Assessment/Plan: 25 yo G1 at 34.3 wks with Preeclampsia with severe range BPs and neural symptoms. Spoke with NICU attending, we do not have NICU beds but patient is not stable for transfer to another facility due to severe range BP and risk of  seizure and severe maternal morbidity  AntiHTN protocol initiated in MAU, Labetalol 3 doses, then moved to Hydralazine protocol and now stable for Ante transfer.  Magnesium for seizure prophylaxis initiated in MAU, at least 24 hours, strict I/O, daily weights, Magnesium level every 6 hrs with CBC CMP BMTZ for FLP, 1st dose in MAU, repeat at 24 hrs regardless of labor IOL  Currently AST elevated at 60, and nl ALT and nl Platelets, but plan to repeat CBC, CMP every 6 hrs, and assess trend. If worsening, plan to move to L&D and start IOL tonight with Cytotec otherwise start Cytotec tomorrow morning and continue to update NICU. Since baby at 34+ wks and EFW per sono yesterday 6 lbs, baby may be safely transported to another NICU as needed   Anxiety/ depression- close observation/ initiate Zoloft as indicated, has declined in pregnancy  Morbid obesity - will benefit from anticoagulation prophylaxis, defer now should we have HELLP diagnosis.  Varicella Non-immune - needs PP VZ vaccine    Findings and plan and likely IOL for severe PEC, remote from delivery, unfavorable cervix, long IOL and possible C/section risk d/w pt. Also possible baby transfer to another facility d/w pt   Robley Fries 10/15/2020, 8:50 PM

## 2020-10-15 NOTE — MAU Note (Signed)
Sent from MD office for BP evaluation.  States went to office visit today for c/o abdominal pain and BP was elevated so she was sent here.  Denies visual disturbances, but endorses epigastric pain and H/A.  Denies FM today.  Denies VB or LOF.

## 2020-10-16 LAB — COMPREHENSIVE METABOLIC PANEL
ALT: 36 U/L (ref 0–44)
ALT: 35 U/L (ref 0–44)
AST: 53 U/L — ABNORMAL HIGH (ref 15–41)
AST: 47 U/L — ABNORMAL HIGH (ref 15–41)
Albumin: 2.3 g/dL — ABNORMAL LOW (ref 3.5–5.0)
Albumin: 2.4 g/dL — ABNORMAL LOW (ref 3.5–5.0)
Alkaline Phosphatase: 128 U/L — ABNORMAL HIGH (ref 38–126)
Alkaline Phosphatase: 135 U/L — ABNORMAL HIGH (ref 38–126)
Anion gap: 10 (ref 5–15)
Anion gap: 10 (ref 5–15)
BUN: 7 mg/dL (ref 6–20)
BUN: 7 mg/dL (ref 6–20)
CO2: 20 mmol/L — ABNORMAL LOW (ref 22–32)
CO2: 22 mmol/L (ref 22–32)
Calcium: 8.8 mg/dL — ABNORMAL LOW (ref 8.9–10.3)
Calcium: 8.6 mg/dL — ABNORMAL LOW (ref 8.9–10.3)
Chloride: 103 mmol/L (ref 98–111)
Chloride: 102 mmol/L (ref 98–111)
Creatinine, Ser: 0.71 mg/dL (ref 0.44–1.00)
Creatinine, Ser: 0.73 mg/dL (ref 0.44–1.00)
GFR, Estimated: >60 mL/min (ref >60)
GFR, Estimated: >60 mL/min (ref >60)
Glucose, Bld: 153 mg/dL — ABNORMAL HIGH (ref 70–99)
Glucose, Bld: 124 mg/dL — ABNORMAL HIGH (ref 70–99)
Potassium: 3.4 mmol/L — ABNORMAL LOW (ref 3.5–5.1)
Potassium: 4 mmol/L (ref 3.5–5.1)
Sodium: 133 mmol/L — ABNORMAL LOW (ref 135–145)
Sodium: 134 mmol/L — ABNORMAL LOW (ref 135–145)
Total Bilirubin: 0.6 mg/dL (ref 0.3–1.2)
Total Bilirubin: 0.3 mg/dL (ref 0.3–1.2)
Total Protein: 6.6 g/dL (ref 6.5–8.1)
Total Protein: 7.2 g/dL (ref 6.5–8.1)

## 2020-10-16 LAB — CBC
HCT: 34.3 % — ABNORMAL LOW (ref 36.0–46.0)
HCT: 36.2 % (ref 36.0–46.0)
Hemoglobin: 10.9 g/dL — ABNORMAL LOW (ref 12.0–15.0)
Hemoglobin: 11.6 g/dL — ABNORMAL LOW (ref 12.0–15.0)
MCH: 26 pg (ref 26.0–34.0)
MCH: 26.4 pg (ref 26.0–34.0)
MCHC: 31.8 g/dL (ref 30.0–36.0)
MCHC: 32 g/dL (ref 30.0–36.0)
MCV: 81.9 fL (ref 80.0–100.0)
MCV: 82.3 fL (ref 80.0–100.0)
Platelets: 156 10*3/uL (ref 150–400)
Platelets: 176 10*3/uL (ref 150–400)
RBC: 4.19 MIL/uL (ref 3.87–5.11)
RBC: 4.4 MIL/uL (ref 3.87–5.11)
RDW: 15.3 % (ref 11.5–15.5)
RDW: 15.6 % — ABNORMAL HIGH (ref 11.5–15.5)
WBC: 8.6 10*3/uL (ref 4.0–10.5)
WBC: 7.4 10*3/uL (ref 4.0–10.5)
nRBC: 0 % (ref 0.0–0.2)
nRBC: 0 % (ref 0.0–0.2)

## 2020-10-16 LAB — RPR: RPR Ser Ql: NONREACTIVE

## 2020-10-16 LAB — GROUP B STREP BY PCR: Group B strep by PCR: POSITIVE — AB

## 2020-10-16 NOTE — Progress Notes (Addendum)
Patient ID: Heather Barron, female   DOB: 1995/04/30, 25 y.o.   MRN: [id number]  Transfer note  25 yo G1 at 34.4 weeks, admitted on 10/15/20 with Preeclampsia with new onset severe range BPs and severe headache. Maternal obesity.  Betamethasone for FLM at 7 pm om 9/28.  On magnesium sulfate for seizure prophylaxis since admission.  She received IV Labetalol 20, 20, 40, 80 mg in triage and then Hydralazine single dose and again 20,40,80 mg Labetalol overnight on Antepartum unit. Her headache has resolved. She meets criteria for delivery. She would prefer vagina delivery. Cervix unfavorable, IOL with Cytotec. NICU at full capacity and cannot deliver at [facility]. So proceeding with transfer to [facility], accepting MD Dr Caralee Ates. Rapid GBS at admission POSITIVE Covid 19 infection on 9/20 (pt had received 3 doses of Covid vaccine)    AM exam BP (!) 157/98   Pulse 87   Temp 98.2 F (36.8 C) (Oral)   Resp 16   Ht 5\' 5"  (1.651 m)   Wt (!) 181.8 kg   LMP 02/09/2020   SpO2 99%   BMI 66.71 kg/m  A&O x 3 NAD Lungs CTA b CV RRR Gravid relaxed uterus LE swelling +1, Homann's s neg. DTR +1 Cx was closed, long and Vx above inlet at admission   Sono in office 9/27 Cephalic 6 lb at 10/27  AC 89% AFI nl   CBC Latest Ref Rng & Units 10/16/2020 10/15/2020  WBC 4.0 - 10.5 K/uL 8.6 8.2  Hemoglobin 12.0 - 15.0 g/dL 10.9(L) 11.0(L)  Hematocrit 36.0 - 46.0 % 34.3(L) 36.3  Platelets 150 - 400 K/uL 156 160     CMP Latest Ref Rng & Units 10/16/2020 10/15/2020  Glucose 70 - 99 mg/dL 10/17/2020) 74  BUN 6 - 20 mg/dL 7 8  Creatinine 354(T - 1.00 mg/dL 6.25 6.38  Sodium 9.37 - 145 mmol/L 133(L) 133(L)  Potassium 3.5 - 5.1 mmol/L 3.4(L) 3.8  Chloride 98 - 111 mmol/L 103 102  CO2 22 - 32 mmol/L 20(L) 22  Calcium 8.9 - 10.3 mg/dL 342) 8.7(G)  Total Protein 6.5 - 8.1 g/dL 6.6 6.7  Total Bilirubin 0.3 - 1.2 mg/dL 0.6 0.3  Alkaline Phos 38 - 126 U/L 128(H) 128(H)  AST 15 - 41 U/L 53(H)  60(H)  ALT 0 - 44 U/L 36 36     Proceed with transfer to [facility] L&D for IOL and NICU care . GBS+  H/o some anxiety/ depression, needs close monitoring PP  Varicella non-immune- needs PP VZ vaccine  Patient understands and accepts. Will inform her primary Ob Dr [facility] re transfer   -Conni Elliot, MD Shea [facility] and infertility

## 2020-10-16 NOTE — Progress Notes (Signed)
Rapid GBS positive

## 2020-10-16 NOTE — Progress Notes (Signed)
Patient ID: Heather Barron, female   DOB: [birth year], 25 y.o.   MRN: [id number] NICU and House coverage request patient to be transferred rather than delivery here then transfer baby due to NICU completely full. Last 2 beds were taken up by preterm twins.   Patient meets criteria for delivery due to severe preeclampsia at 34+ wks. Since we don't have NICU bed that this baby will need, I called [facility] and spoke with on-call Ob Dr Caralee Ates who has graciously accepted maternal transfer since they have NICU bed availability. Patient was d/w accepting physician Dr Caralee Ates and House coverage informed who will communicate with [facility] once they have bed-placement at [facility].   Pt advised about situation with our NICU and her transfer to [facility] is my best option rather than transferring baby later especially since we have stabilized her severe range BPs overnight and now she is stable for transfer. She will have excellent care there as well.    Shea Evans MD

## 2020-10-16 NOTE — Progress Notes (Signed)
Transfer note   25 yo G1 at 34.4 weeks, admitted on 10/15/20 with Preeclampsia with new onset severe range BPs and severe headache. Maternal obesity.  Betamethasone for FLM at 7 pm om 9/28.  On magnesium sulfate for seizure prophylaxis since admission.  She received IV Labetalol 20, 20, 40, 80 mg in triage and then Hydralazine single dose and again 20,40,80 mg Labetalol overnight on Antepartum unit. Her headache has resolved. She meets criteria for delivery. She would prefer vagina delivery. Cervix unfavorable, IOL with Cytotec. NICU at full capacity and cannot deliver at [facility]. So proceeding with transfer to [facility], accepting MD Dr Caralee Ates. Rapid GBS at admission POSITIVE Covid 19 infection on 9/20 (pt had received 3 doses of Covid vaccine)      AM exam BP (!) 157/98   Pulse 87   Temp 98.2 F (36.8 C) (Oral)   Resp 16   Ht 5\' 5"  (1.651 m)   Wt (!) 181.8 kg   LMP 02/09/2020   SpO2 99%   BMI 66.71 kg/m  A&O x 3 NAD Lungs CTA b CV RRR Gravid relaxed uterus LE swelling +1, Homann's s neg. DTR +1 Cx was closed, long and Vx above inlet at admission   Sono in office 9/27 Cephalic 6 lb at 10/27  AC 89% AFI nl    CBC Latest Ref Rng & Units 10/16/2020 10/15/2020  WBC 4.0 - 10.5 K/uL 8.6 8.2  Hemoglobin 12.0 - 15.0 g/dL 10.9(L) 11.0(L)  Hematocrit 36.0 - 46.0 % 34.3(L) 36.3  Platelets 150 - 400 K/uL 156 160      CMP Latest Ref Rng & Units 10/16/2020 10/15/2020  Glucose 70 - 99 mg/dL 10/17/2020) 74  BUN 6 - 20 mg/dL 7 8  Creatinine 086(V - 1.00 mg/dL 7.84 6.96  Sodium 2.95 - 145 mmol/L 133(L) 133(L)  Potassium 3.5 - 5.1 mmol/L 3.4(L) 3.8  Chloride 98 - 111 mmol/L 103 102  CO2 22 - 32 mmol/L 20(L) 22  Calcium 8.9 - 10.3 mg/dL 284) 1.3(K)  Total Protein 6.5 - 8.1 g/dL 6.6 6.7  Total Bilirubin 0.3 - 1.2 mg/dL 0.6 0.3  Alkaline Phos 38 - 126 U/L 128(H) 128(H)  AST 15 - 41 U/L 53(H) 60(H)  ALT 0 - 44 U/L 36 36      Proceed with transfer to [facility] L&D for IOL  and NICU care . GBS+  H/o some anxiety/ depression, needs close monitoring PP  Varicella non-immune- needs PP VZ vaccine   Patient understands and accepts. Will inform her primary Ob Dr [facility] re transfer    -Conni Elliot, MD [facility]

## 2020-10-16 NOTE — Discharge Summary (Signed)
Physician Discharge Summary  Patient ID: Heather Barron MRN: [id number] DOB/AGE: July 17, 1995 25 y.o.  Admit date: 10/15/2020 Discharge date: 10/16/2020  Admission Diagnoses: G1, 34.3 weeks, Severe Preeclampsia by blood pressure and severe headache  Discharge Diagnoses: Same, elevated AST  Discharged Condition: Transferred to [facility] on 9/29 morning due to patient needing delivery but NICU bed not available for baby   Hospital Course:  25 yo G1 at 34.4 weeks, admitted on 10/15/20 with Preeclampsia with new onset severe range BPs and severe headache. Maternal obesity.  Betamethasone for FLM at 7 pm om 9/28.  On magnesium sulfate for seizure prophylaxis since admission.  She received IV Labetalol 20, 20, 40, 80 mg in triage and then Hydralazine single dose and again 20,40,80 mg Labetalol overnight on Antepartum unit. Her headache has resolved. She meets criteria for delivery. She would prefer vagina delivery. Cervix unfavorable, IOL with Cytotec. NICU at full capacity and cannot deliver at [facility]. So proceeding with transfer to [facility], accepting MD Dr Caralee Ates. Rapid GBS at admission POSITIVE Covid 19 infection on 9/20 (pt had received 3 doses of Covid vaccine)     10/16/20 pre-transfer exam BP (!) 157/98   Pulse 87   Temp 98.2 F (36.8 C) (Oral)   Resp 16   Ht 5\' 5"  (1.651 m)   Wt (!) 181.8 kg   LMP 02/09/2020   SpO2 99%   BMI 66.71 kg/m  A&O x 3 NAD Lungs CTA b CV RRR Gravid relaxed uterus LE swelling +1, Homann's s neg. DTR +1 Cx was closed, long and Vx above inlet at admission   Sono in office 9/27 Cephalic 6 lb at 10/27  AC 89% AFI nl    CBC Latest Ref Rng & Units 10/16/2020 10/15/2020  WBC 4.0 - 10.5 K/uL 8.6 8.2  Hemoglobin 12.0 - 15.0 g/dL 10.9(L) 11.0(L)  Hematocrit 36.0 - 46.0 % 34.3(L) 36.3  Platelets 150 - 400 K/uL 156 160      CMP Latest Ref Rng & Units 10/16/2020 10/15/2020  Glucose 70 - 99 mg/dL 10/17/2020) 74  BUN 6 - 20 mg/dL 7 8   Creatinine 628(Z - 1.00 mg/dL 6.62 9.47  Sodium 6.54 - 145 mmol/L 133(L) 133(L)  Potassium 3.5 - 5.1 mmol/L 3.4(L) 3.8  Chloride 98 - 111 mmol/L 103 102  CO2 22 - 32 mmol/L 20(L) 22  Calcium 8.9 - 10.3 mg/dL 650) 3.5(W)  Total Protein 6.5 - 8.1 g/dL 6.6 6.7  Total Bilirubin 0.3 - 1.2 mg/dL 0.6 0.3  Alkaline Phos 38 - 126 U/L 128(H) 128(H)  AST 15 - 41 U/L 53(H) 60(H)  ALT 0 - 44 U/L 36 36      Proceed with transfer to [facility] L&D for IOL and NICU care . GBS+  H/o some anxiety/ depression, needs close monitoring PP  Varicella non-immune- needs PP VZ vaccine   Patient understands and accepts. Will inform her primary Ob Dr [facility] re transfer   Disposition: [facility] L[facility] for induction for severe preeclampsia due to NICU bed not available at [facility]    Signed: [facility] 10/16/2020, 10:30 AM

## 2020-10-18 HISTORY — PX: CHOLESTEATOMA EXCISION: SHX1345

## 2020-11-13 ENCOUNTER — Inpatient Hospital Stay (HOSPITAL_COMMUNITY): Payer: BC Managed Care – PPO

## 2020-11-13 ENCOUNTER — Encounter (HOSPITAL_COMMUNITY): Payer: Self-pay | Admitting: Obstetrics and Gynecology

## 2020-11-13 ENCOUNTER — Inpatient Hospital Stay (EMERGENCY_DEPARTMENT_HOSPITAL)
Admission: AD | Admit: 2020-11-13 | Discharge: 2020-11-13 | Disposition: A | Discharge status: Home / Self Care | Payer: BC Managed Care – PPO | Source: Home / Self Care | Attending: Obstetrics and Gynecology | Admitting: Obstetrics and Gynecology

## 2020-11-13 ENCOUNTER — Other Ambulatory Visit: Payer: Self-pay

## 2020-11-13 DIAGNOSIS — O9081 Anemia of the puerperium: Secondary | ICD-10-CM | POA: Diagnosis not present

## 2020-11-13 DIAGNOSIS — O99215 Obesity complicating the puerperium: Secondary | ICD-10-CM | POA: Diagnosis not present

## 2020-11-13 DIAGNOSIS — K219 Gastro-esophageal reflux disease without esophagitis: Secondary | ICD-10-CM | POA: Diagnosis not present

## 2020-11-13 DIAGNOSIS — O9963 Diseases of the digestive system complicating the puerperium: Secondary | ICD-10-CM

## 2020-11-13 DIAGNOSIS — Z20822 Contact with and (suspected) exposure to covid-19: Secondary | ICD-10-CM | POA: Diagnosis not present

## 2020-11-13 DIAGNOSIS — R1010 Upper abdominal pain, unspecified: Secondary | ICD-10-CM

## 2020-11-13 DIAGNOSIS — K828 Other specified diseases of gallbladder: Secondary | ICD-10-CM | POA: Diagnosis not present

## 2020-11-13 LAB — CBC WITH DIFFERENTIAL/PLATELET
Abs Immature Granulocytes: 0.02 10*3/uL (ref 0.00–0.07)
Basophils Absolute: 0 10*3/uL (ref 0.0–0.1)
Basophils Relative: 0 %
Eosinophils Absolute: 0.1 10*3/uL (ref 0.0–0.5)
Eosinophils Relative: 1 %
HCT: 38.1 % (ref 36.0–46.0)
Hemoglobin: 11.7 g/dL — ABNORMAL LOW (ref 12.0–15.0)
Immature Granulocytes: 0 %
Lymphocytes Relative: 21 %
Lymphs Abs: 1.3 10*3/uL (ref 0.7–4.0)
MCH: 25.9 pg — ABNORMAL LOW (ref 26.0–34.0)
MCHC: 30.7 g/dL (ref 30.0–36.0)
MCV: 84.3 fL (ref 80.0–100.0)
Monocytes Absolute: 0.3 10*3/uL (ref 0.1–1.0)
Monocytes Relative: 6 %
Neutro Abs: 4.4 10*3/uL (ref 1.7–7.7)
Neutrophils Relative %: 72 %
Platelets: 295 10*3/uL (ref 150–400)
RBC: 4.52 MIL/uL (ref 3.87–5.11)
RDW: 14.2 % (ref 11.5–15.5)
WBC: 6.1 10*3/uL (ref 4.0–10.5)
nRBC: 0 % (ref 0.0–0.2)

## 2020-11-13 LAB — URINALYSIS, ROUTINE W REFLEX MICROSCOPIC
Bacteria, UA: NONE SEEN
Bilirubin Urine: NEGATIVE
Glucose, UA: NEGATIVE mg/dL
Hgb urine dipstick: NEGATIVE
Ketones, ur: NEGATIVE mg/dL
Leukocytes,Ua: NEGATIVE
Nitrite: NEGATIVE
Protein, ur: 100 mg/dL — AB
Specific Gravity, Urine: 1.024 (ref 1.005–1.030)
pH: 6 (ref 5.0–8.0)

## 2020-11-13 LAB — LIPASE, BLOOD: Lipase: 44 U/L (ref 11–51)

## 2020-11-13 LAB — COMPREHENSIVE METABOLIC PANEL
ALT: 44 U/L (ref 0–44)
AST: 97 U/L — ABNORMAL HIGH (ref 15–41)
Albumin: 3.4 g/dL — ABNORMAL LOW (ref 3.5–5.0)
Alkaline Phosphatase: 78 U/L (ref 38–126)
Anion gap: 7 (ref 5–15)
BUN: 9 mg/dL (ref 6–20)
CO2: 27 mmol/L (ref 22–32)
Calcium: 9 mg/dL (ref 8.9–10.3)
Chloride: 102 mmol/L (ref 98–111)
Creatinine, Ser: 0.8 mg/dL (ref 0.44–1.00)
GFR, Estimated: >60 mL/min (ref >60)
Glucose, Bld: 95 mg/dL (ref 70–99)
Potassium: 3.7 mmol/L (ref 3.5–5.1)
Sodium: 136 mmol/L (ref 135–145)
Total Bilirubin: 0.8 mg/dL (ref 0.3–1.2)
Total Protein: 7.7 g/dL (ref 6.5–8.1)

## 2020-11-13 LAB — TROPONIN I (HIGH SENSITIVITY): Troponin I (High Sensitivity): 3 ng/L (ref <18)

## 2020-11-13 MED ORDER — FAMOTIDINE 40 MG PO TABS: 40.0000 mg | ORAL_TABLET | Freq: Every day | ORAL | 0 refills | Status: DC

## 2020-11-13 MED ORDER — KETOROLAC TROMETHAMINE 30 MG/ML IJ SOLN
30.0000 mg | Freq: Once | INTRAMUSCULAR | Status: AC
  Administered 2020-11-13: 30 mg via INTRAVENOUS
  Filled 2020-11-13: qty 1

## 2020-11-13 MED ORDER — ALPRAZOLAM 0.5 MG PO TABS
1.0000 mg | ORAL_TABLET | Freq: Once | ORAL | Status: AC
  Administered 2020-11-13: 1 mg via ORAL

## 2020-11-13 MED ORDER — ALPRAZOLAM 1 MG PO TABS
1.0000 mg | ORAL_TABLET | Freq: Once | ORAL | Status: DC
  Filled 2020-11-13: qty 1

## 2020-11-13 MED ORDER — FAMOTIDINE 20 MG PO TABS
40.0000 mg | ORAL_TABLET | Freq: Once | ORAL | Status: AC
  Administered 2020-11-13: 40 mg via ORAL
  Filled 2020-11-13: qty 2

## 2020-11-13 NOTE — Discharge Instructions (Signed)
If your abdominal pain continues and you would like to further discuss gallbladder surgery, please contact the [facility] office at [phone] and ask to schedule an appointment. When you call please let the office know that you have already been seen by a surgeon from our group while you were at [facility].

## 2020-11-13 NOTE — MAU Provider Note (Addendum)
History     CSN: [id number]  Arrival date and time: 11/13/20 1806   Event Date/Time   First Provider Initiated Contact with Patient 11/13/20 1849      Chief Complaint  Patient presents with   Abdominal Pain   Nausea   HPI  [name] is a 25 y.o. female G1P0001 status post C/S on 10/15/2020 here with upper abdominal pain. She reports middle/upper abdominal pain that started at 0200, it kept her awake until 0600. Nothing helped the pain. She took Oxycode and tylenol. The pain is constant. The pain does not radiate. The pain feels like knocking on her upper abdomen. She had this pain a few times while pregnant and was told it was the baby pushing on her abdomen.  The pain stopped at 0600 and started again today noon. She ate a hot dog at 0900 am this morning, and for dinner last night she ate fried chicken. The pain improves when she is sitting up right and slightly bending forward.   OB History     Gravida  1   Para  1   Term  0   Preterm  0   AB  0   Living  1      SAB  0   IAB  0   Ectopic  0   Multiple  0   Live Births  0          Past Medical History:  Diagnosis Date   Headache    Patient denies medical problems    Sleep apnea    Vaginal Pap smear, abnormal    Past Surgical History:  Procedure Laterality Date   denies     NO PAST SURGERIES     Family History  Problem Relation Age of Onset   Diabetes Mother    Ovarian cysts Sister    Pancreatic cancer Maternal Grandmother    Social History   Tobacco Use   Smoking status: Never   Smokeless tobacco: Never  Vaping Use   Vaping Use: Never used  Substance Use Topics   Alcohol use: Yes    Comment: occasionally   Drug use: Never   Allergies: No Known Allergies  No medications prior to admission.   Results for orders placed or performed during the hospital encounter of 11/13/20 (from the past 48 hour(s))  Urinalysis, Routine w reflex microscopic Urine, Clean Catch     Status:  Abnormal   Collection Time: 11/13/20  6:44 PM  Result Value Ref Range   Color, Urine AMBER (A) YELLOW    Comment: BIOCHEMICALS MAY BE AFFECTED BY COLOR   APPearance HAZY (A) CLEAR   Specific Gravity, Urine 1.024 1.005 - 1.030   pH 6.0 5.0 - 8.0   Glucose, UA NEGATIVE NEGATIVE mg/dL   Hgb urine dipstick NEGATIVE NEGATIVE   Bilirubin Urine NEGATIVE NEGATIVE   Ketones, ur NEGATIVE NEGATIVE mg/dL   Protein, ur 903 (A) NEGATIVE mg/dL   Nitrite NEGATIVE NEGATIVE   Leukocytes,Ua NEGATIVE NEGATIVE   RBC / HPF 0-5 0 - 5 RBC/hpf   WBC, UA 6-10 0 - 5 WBC/hpf   Bacteria, UA NONE SEEN NONE SEEN   Squamous Epithelial / LPF 0-5 0 - 5   Mucus PRESENT     Comment: Performed at [facility], [address], [place], [place] [address]  CBC with Differential/Platelet     Status: Abnormal   Collection Time: 11/13/20  7:05 PM  Result Value Ref Range  WBC 6.1 4.0 - 10.5 K/uL   RBC 4.52 3.87 - 5.11 MIL/uL   Hemoglobin 11.7 (L) 12.0 - 15.0 g/dL   HCT 24.0 97.3 - 53.2 %   MCV 84.3 80.0 - 100.0 fL   MCH 25.9 (L) 26.0 - 34.0 pg   MCHC 30.7 30.0 - 36.0 g/dL   RDW 99.2 42.6 - 83.4 %   Platelets 295 150 - 400 K/uL   nRBC 0.0 0.0 - 0.2 %   Neutrophils Relative % 72 %   Neutro Abs 4.4 1.7 - 7.7 K/uL   Lymphocytes Relative 21 %   Lymphs Abs 1.3 0.7 - 4.0 K/uL   Monocytes Relative 6 %   Monocytes Absolute 0.3 0.1 - 1.0 K/uL   Eosinophils Relative 1 %   Eosinophils Absolute 0.1 0.0 - 0.5 K/uL   Basophils Relative 0 %   Basophils Absolute 0.0 0.0 - 0.1 K/uL   Immature Granulocytes 0 %   Abs Immature Granulocytes 0.02 0.00 - 0.07 K/uL    Comment: Performed at [facility], [address], [place], [place] [address]  Comprehensive metabolic panel     Status: Abnormal   Collection Time: 11/13/20  7:05 PM  Result Value Ref Range   Sodium 136 135 - 145 mmol/L   Potassium 3.7 3.5 - 5.1 mmol/L   Chloride 102 98 - 111 mmol/L   CO2 27 22 - 32 mmol/L   Glucose, Bld 95 70 - 99 mg/dL    Comment:  Glucose reference range applies only to samples taken after fasting for at least 8 hours.   BUN 9 6 - 20 mg/dL   Creatinine, Ser 2.97 0.44 - 1.00 mg/dL   Calcium 9.0 8.9 - 98.9 mg/dL   Total Protein 7.7 6.5 - 8.1 g/dL   Albumin 3.4 (L) 3.5 - 5.0 g/dL   AST 97 (H) 15 - 41 U/L   ALT 44 0 - 44 U/L   Alkaline Phosphatase 78 38 - 126 U/L   Total Bilirubin 0.8 0.3 - 1.2 mg/dL   GFR, Estimated >21 >19 mL/min    Comment: (NOTE) Calculated using the CKD-EPI Creatinine Equation (2021)    Anion gap 7 5 - 15    Comment: Performed at [facility], [address], [place], [place] [address]  Lipase, blood     Status: None   Collection Time: 11/13/20  7:05 PM  Result Value Ref Range   Lipase 44 11 - 51 U/L    Comment: Performed at [facility], [address], [place], [place] [address]    US ABDOMEN LIMITED RUQ (LIVER/GB)  Result Date: 11/13/2020 CLINICAL DATA:  Postpartum upper abdominal pain. EXAM: ULTRASOUND ABDOMEN LIMITED RIGHT UPPER QUADRANT COMPARISON:  None. FINDINGS: Gallbladder: Subcentimeter echogenic gallstones are suspected within the dependent portion of the gallbladder lumen. The largest measures approximately 6 mm. There is no evidence of gallbladder wall thickening (approximately 2.0 mm). A positive sonographic Murphy's sign is noted by sonographer. Common bile duct: Diameter: 5.5 mm Liver: No focal lesion identified. Within normal limits in parenchymal echogenicity. Portal vein is patent on color Doppler imaging with normal direction of blood flow towards the liver. Other: None. IMPRESSION: Cholelithiasis in the setting of a positive sonographic Murphy's sign which is suggestive of acute cholecystitis. Further evaluation with a nuclear medicine hepatobiliary scan is recommended. Electronically Signed   By: Aram Candela M.D.   On: 11/13/2020 20:08     Review of Systems  Constitutional:  Negative for fever.  Respiratory:  Negative for shortness of breath.    Cardiovascular:  Negative for chest pain.  Gastrointestinal:  Positive for abdominal pain and nausea. Negative for vomiting.  Physical Exam   Blood pressure 140/74, pulse 78, temperature 98.8 F (37.1 C), temperature source Oral, resp. rate 17, SpO2 98 %, unknown if currently breastfeeding.  Patient Vitals for the past 24 hrs:  BP Temp Temp src Pulse Resp SpO2  11/13/20 1838 140/74 98.8 F (37.1 C) Oral 78 17 98 %    Physical Exam Constitutional:      General: She is not in acute distress.    Appearance: She is well-developed. She is obese. She is not ill-appearing, toxic-appearing or diaphoretic.  Abdominal:     Palpations: Abdomen is soft.     Tenderness: There is abdominal tenderness in the epigastric area. There is no guarding or rebound. Positive signs include Murphy's sign.     Hernia: No hernia is present.  Neurological:     Mental Status: She is alert.   MAU Course  Procedures None  MDM  Toradol for pain.  CBC with diff, CMP Lipase and troponin  Ultrasound RUQ: reviewed with Dr. Crissie Reese, MRCP ordered.  Patient requests medication for claustrophobia for MRI.  Report given to Dr. Crissie Reese, who resumes care of the patient.  Rasch, Harolyn Rutherford, NP  Dr. Crissie Reese was called to the OR and turned patient care over to me at 2130.   Pain and nausea improved with pepcid dose.  General surgery consulted to review blood work and U/S report (prior to MRCP). Dr. Sophronia Simas reviewed and came to assess patient. Reported that presentation seemed more consistent with reflux than cholecystitis and did not agree with ultrasound report regarding presence of cholelithiasis, also recommended we not do the MRCP inpatient tonight. Spoke to patient and recommended discharge home on pepcid and follow up outpatient if she continued to have similar episodes.  Assessment and Plan  Gastroesophageal reflux - prescribed pepcid daily for 30 days - pt to follow up with general surgery if needed  and postpartum as scheduled  Edd Arbour, CNM, MSN, IBCLC Certified Nurse Midwife, [facility]

## 2020-11-13 NOTE — Consult Note (Signed)
Heather Barron 02/03/1995  [id number].    Chief Complaint/Reason for Consult: abdominal pain, concern for cholecystitis  HPI:  Heather Barron is a 25 year old female who is 1 month postpartum, who presented to the MAU today with upper abdominal pain.  She has had several episodes of pain in the epigastric area which woke her from sleep this morning.  It occurred again later in the day and prompted her to come to the MAU for evaluation.  He says she also had several episodes like this during her pregnancy.  The pain does not seem to be related to eating and occurs at random times.  It does get worse when she lays down.  Her WBC is normal, and AST is mildly elevated but LFTs are otherwise normal.  A RUQ US showed possible cholelithiasis and a positive sonographic Eulah Pont sign was reported.  General surgery was consulted due to concern for cholecystitis.  At the time of my exam, the patient reported her pain had resolved after receiving Pepcid and Toradol which she said helped with her symptoms.  She has never been diagnosed with GERD and does not take any antacid medications at home.  She delivered via a C-section but has not had any of the other abdominal surgeries.  She has no chronic medical conditions.  ROS: Review of Systems  Constitutional:  Negative for chills and fever.  Respiratory:  Negative for shortness of breath.   Gastrointestinal:  Positive for abdominal pain. Negative for nausea and vomiting.  Genitourinary:  Negative for dysuria.  Neurological:  Negative for weakness.   Family History  Problem Relation Age of Onset   Diabetes Mother    Ovarian cysts Sister    Pancreatic cancer Maternal Grandmother     Past Medical History:  Diagnosis Date   Headache    Patient denies medical problems    Sleep apnea    Vaginal Pap smear, abnormal     Past Surgical History:  Procedure Laterality Date   denies     NO PAST SURGERIES      Social History:  reports that she has  never smoked. She has never used smokeless tobacco. She reports current alcohol use. She reports that she does not use drugs.  Allergies: No Known Allergies  No medications prior to admission.     Physical Exam: Blood pressure 139/77, pulse 72, temperature 98.8 F (37.1 C), temperature source Oral, resp. rate 17, SpO2 98 %, unknown if currently breastfeeding. General: resting comfortably, appears stated age, no apparent distress Neurological: alert and oriented, no focal deficits, cranial nerves grossly in tact HEENT: normocephalic, atraumatic, oropharynx clear, no scleral icterus CV: regular rate and rhythm, extremities warm and well-perfused Respiratory: normal work of breathing on room air, symmetric chest wall expansion Abdomen: soft, nondistended, nontender to deep palpation. No masses or organomegaly. Extremities: warm and well-perfused, no deformities, moving all extremities spontaneously Psychiatric: normal mood and affect Skin: warm and dry, no jaundice, no rashes or lesions   Results for orders placed or performed during the hospital encounter of 11/13/20 (from the past 48 hour(s))  Urinalysis, Routine w reflex microscopic Urine, Clean Catch     Status: Abnormal   Collection Time: 11/13/20  6:44 PM  Result Value Ref Range   Color, Urine AMBER (A) YELLOW    Comment: BIOCHEMICALS MAY BE AFFECTED BY COLOR   APPearance HAZY (A) CLEAR   Specific Gravity, Urine 1.024 1.005 - 1.030   pH 6.0 5.0 - 8.0  Glucose, UA NEGATIVE NEGATIVE mg/dL   Hgb urine dipstick NEGATIVE NEGATIVE   Bilirubin Urine NEGATIVE NEGATIVE   Ketones, ur NEGATIVE NEGATIVE mg/dL   Protein, ur 063 (A) NEGATIVE mg/dL   Nitrite NEGATIVE NEGATIVE   Leukocytes,Ua NEGATIVE NEGATIVE   RBC / HPF 0-5 0 - 5 RBC/hpf   WBC, UA 6-10 0 - 5 WBC/hpf   Bacteria, UA NONE SEEN NONE SEEN   Squamous Epithelial / LPF 0-5 0 - 5   Mucus PRESENT     Comment: Performed at [facility], [address], [place],  [place] [address]  CBC with Differential/Platelet     Status: Abnormal   Collection Time: 11/13/20  7:05 PM  Result Value Ref Range   WBC 6.1 4.0 - 10.5 K/uL   RBC 4.52 3.87 - 5.11 MIL/uL   Hemoglobin 11.7 (L) 12.0 - 15.0 g/dL   HCT 09.3 23.5 - 57.3 %   MCV 84.3 80.0 - 100.0 fL   MCH 25.9 (L) 26.0 - 34.0 pg   MCHC 30.7 30.0 - 36.0 g/dL   RDW 22.0 25.4 - 27.0 %   Platelets 295 150 - 400 K/uL   nRBC 0.0 0.0 - 0.2 %   Neutrophils Relative % 72 %   Neutro Abs 4.4 1.7 - 7.7 K/uL   Lymphocytes Relative 21 %   Lymphs Abs 1.3 0.7 - 4.0 K/uL   Monocytes Relative 6 %   Monocytes Absolute 0.3 0.1 - 1.0 K/uL   Eosinophils Relative 1 %   Eosinophils Absolute 0.1 0.0 - 0.5 K/uL   Basophils Relative 0 %   Basophils Absolute 0.0 0.0 - 0.1 K/uL   Immature Granulocytes 0 %   Abs Immature Granulocytes 0.02 0.00 - 0.07 K/uL    Comment: Performed at [facility], [address], [place], [place] [address]  Comprehensive metabolic panel     Status: Abnormal   Collection Time: 11/13/20  7:05 PM  Result Value Ref Range   Sodium 136 135 - 145 mmol/L   Potassium 3.7 3.5 - 5.1 mmol/L   Chloride 102 98 - 111 mmol/L   CO2 27 22 - 32 mmol/L   Glucose, Bld 95 70 - 99 mg/dL    Comment: Glucose reference range applies only to samples taken after fasting for at least 8 hours.   BUN 9 6 - 20 mg/dL   Creatinine, Ser 2.83 0.44 - 1.00 mg/dL   Calcium 9.0 8.9 - 15.1 mg/dL   Total Protein 7.7 6.5 - 8.1 g/dL   Albumin 3.4 (L) 3.5 - 5.0 g/dL   AST 97 (H) 15 - 41 U/L   ALT 44 0 - 44 U/L   Alkaline Phosphatase 78 38 - 126 U/L   Total Bilirubin 0.8 0.3 - 1.2 mg/dL   GFR, Estimated >76 >16 mL/min    Comment: (NOTE) Calculated using the CKD-EPI Creatinine Equation (2021)    Anion gap 7 5 - 15    Comment: Performed at [facility], [address] [place], [place] [address]  Lipase, blood     Status: None   Collection Time: 11/13/20  7:05 PM  Result Value Ref Range   Lipase 44 11 - 51 U/L    Comment:  Performed at [facility], [address], [place], [place] [address]  Troponin I (High Sensitivity)     Status: None   Collection Time: 11/13/20  7:05 PM  Result Value Ref Range   Troponin I (High  Sensitivity) 3 <18 ng/L    Comment: (NOTE) Elevated high sensitivity troponin I (hsTnI) values and significant  changes across serial measurements may suggest ACS but many other  chronic and acute conditions are known to elevate hsTnI results.  Refer to the "Links" section for chest pain algorithms and additional  guidance. Performed at [facility], [address], [place], [place] [address]    US ABDOMEN LIMITED RUQ (LIVER/GB)  Result Date: 11/13/2020 CLINICAL DATA:  Postpartum upper abdominal pain. EXAM: ULTRASOUND ABDOMEN LIMITED RIGHT UPPER QUADRANT COMPARISON:  None. FINDINGS: Gallbladder: Subcentimeter echogenic gallstones are suspected within the dependent portion of the gallbladder lumen. The largest measures approximately 6 mm. There is no evidence of gallbladder wall thickening (approximately 2.0 mm). A positive sonographic Murphy's sign is noted by sonographer. Common bile duct: Diameter: 5.5 mm Liver: No focal lesion identified. Within normal limits in parenchymal echogenicity. Portal vein is patent on color Doppler imaging with normal direction of blood flow towards the liver. Other: None. IMPRESSION: Cholelithiasis in the setting of a positive sonographic Murphy's sign which is suggestive of acute cholecystitis. Further evaluation with a nuclear medicine hepatobiliary scan is recommended. Electronically Signed   By: Aram Candela M.D.   On: 11/13/2020 20:08      Assessment/Plan This is a 25 year old female presenting with intermittent epigastric abdominal pain.  I personally reviewed her ultrasound, which shows several very small gallstones but no gallbladder wall thickening and no pericholecystic fluid.  She does not have a Murphy sign on exam, and no fevers or  leukocytosis to suggest cholecystitis.  Her pain is not typical of biliary colic, and seems more consistent with GERD-type symptoms given that it worsens in the supine position and improved with H2-blocker treatment.  I do not think she needs any further imaging evaluation and would not recommend urgent cholecystectomy.  I did counsel her to continue to monitor her symptoms and observe whether or not they tend to occur after eating.  Her pain improved after taking Pepcid this evening, and I recommended that she continue taking this to see if it helps with her symptoms.  If her symptoms continue and she would like to discuss cholecystectomy, she can be evaluated in clinic.  I offered scheduling an appointment versus her calling if she would like to be seen as an outpatient, and she will call our clinic if symptoms persist.  I provided our office number in her written discharge instructions.  All questions were answered.   Sophronia Simas, MD [facility] General, Hepatobiliary and Pancreatic Surgery 11/13/20 11:14 PM

## 2020-11-13 NOTE — MAU Note (Signed)
Pt C/S 0929/2022 presents with complaint of mid upper abd pain that started last pm. She took her pain meds and pain went away but came back and her provider told her she needed to come in because she had had pre-e. Denies headache and blurred vision. Nausea since pain started.

## 2020-11-14 ENCOUNTER — Encounter (HOSPITAL_COMMUNITY): Payer: Self-pay | Admitting: Obstetrics and Gynecology

## 2020-11-14 ENCOUNTER — Observation Stay (HOSPITAL_COMMUNITY)
Admission: AD | Admit: 2020-11-14 | Discharge: 2020-11-14 | Discharge status: Against Medical Advice | Payer: BC Managed Care – PPO | Source: Ambulatory Visit | Attending: Internal Medicine | Admitting: Internal Medicine

## 2020-11-14 ENCOUNTER — Inpatient Hospital Stay (HOSPITAL_COMMUNITY): Payer: BC Managed Care – PPO

## 2020-11-14 DIAGNOSIS — Z6841 Body Mass Index (BMI) 40.0 and over, adult: Secondary | ICD-10-CM

## 2020-11-14 DIAGNOSIS — Z5329 Procedure and treatment not carried out because of patient's decision for other reasons: Secondary | ICD-10-CM | POA: Diagnosis present

## 2020-11-14 DIAGNOSIS — Z800 Family history of malignant neoplasm of digestive organs: Secondary | ICD-10-CM

## 2020-11-14 DIAGNOSIS — G4733 Obstructive sleep apnea (adult) (pediatric): Secondary | ICD-10-CM | POA: Diagnosis present

## 2020-11-14 DIAGNOSIS — K828 Other specified diseases of gallbladder: Secondary | ICD-10-CM | POA: Diagnosis present

## 2020-11-14 DIAGNOSIS — Z9989 Dependence on other enabling machines and devices: Secondary | ICD-10-CM

## 2020-11-14 DIAGNOSIS — R1013 Epigastric pain: Secondary | ICD-10-CM

## 2020-11-14 DIAGNOSIS — K219 Gastro-esophageal reflux disease without esophagitis: Secondary | ICD-10-CM | POA: Insufficient documentation

## 2020-11-14 DIAGNOSIS — R109 Unspecified abdominal pain: Secondary | ICD-10-CM | POA: Diagnosis present

## 2020-11-14 DIAGNOSIS — E669 Obesity, unspecified: Secondary | ICD-10-CM | POA: Diagnosis not present

## 2020-11-14 DIAGNOSIS — D509 Iron deficiency anemia, unspecified: Secondary | ICD-10-CM

## 2020-11-14 DIAGNOSIS — O9081 Anemia of the puerperium: Secondary | ICD-10-CM | POA: Insufficient documentation

## 2020-11-14 DIAGNOSIS — Z20822 Contact with and (suspected) exposure to covid-19: Secondary | ICD-10-CM | POA: Insufficient documentation

## 2020-11-14 DIAGNOSIS — O99215 Obesity complicating the puerperium: Secondary | ICD-10-CM | POA: Diagnosis present

## 2020-11-14 DIAGNOSIS — O9963 Diseases of the digestive system complicating the puerperium: Secondary | ICD-10-CM | POA: Insufficient documentation

## 2020-11-14 DIAGNOSIS — Z833 Family history of diabetes mellitus: Secondary | ICD-10-CM | POA: Diagnosis not present

## 2020-11-14 DIAGNOSIS — K8000 Calculus of gallbladder with acute cholecystitis without obstruction: Secondary | ICD-10-CM | POA: Diagnosis not present

## 2020-11-14 DIAGNOSIS — K8020 Calculus of gallbladder without cholecystitis without obstruction: Secondary | ICD-10-CM

## 2020-11-14 DIAGNOSIS — R7401 Elevation of levels of liver transaminase levels: Secondary | ICD-10-CM

## 2020-11-14 LAB — RESP PANEL BY RT-PCR (FLU A&B, COVID) ARPGX2
Influenza A by PCR: NEGATIVE
Influenza B by PCR: NEGATIVE
SARS Coronavirus 2 by RT PCR: NEGATIVE

## 2020-11-14 MED ORDER — SODIUM CHLORIDE 0.9 % IV SOLN: INTRAVENOUS | Status: DC

## 2020-11-14 MED ORDER — PANTOPRAZOLE SODIUM 40 MG IV SOLR
40.0000 mg | INTRAVENOUS | Status: DC
  Administered 2020-11-14: 40 mg via INTRAVENOUS
  Filled 2020-11-14: qty 40

## 2020-11-14 MED ORDER — FAMOTIDINE 20 MG PO TABS: 40.0000 mg | ORAL_TABLET | Freq: Every day | ORAL | Status: DC

## 2020-11-14 MED ORDER — ONDANSETRON HCL 4 MG/2ML IJ SOLN: 4.0000 mg | Freq: Four times a day (QID) | INTRAMUSCULAR | Status: DC | PRN

## 2020-11-14 MED ORDER — ONDANSETRON HCL 4 MG PO TABS: 4.0000 mg | ORAL_TABLET | Freq: Four times a day (QID) | ORAL | Status: DC | PRN

## 2020-11-14 MED ORDER — ACETAMINOPHEN 650 MG RE SUPP: 650.0000 mg | Freq: Four times a day (QID) | RECTAL | Status: DC | PRN

## 2020-11-14 MED ORDER — ACETAMINOPHEN 325 MG PO TABS: 650.0000 mg | ORAL_TABLET | Freq: Four times a day (QID) | ORAL | Status: DC | PRN

## 2020-11-14 MED ORDER — PIPERACILLIN-TAZOBACTAM 3.375 G IVPB
3.3750 g | Freq: Three times a day (TID) | INTRAVENOUS | Status: DC
  Administered 2020-11-14: 3.375 g via INTRAVENOUS
  Filled 2020-11-14 (×3): qty 50

## 2020-11-14 MED ORDER — SODIUM CHLORIDE 0.9% FLUSH: 3.0000 mL | Freq: Two times a day (BID) | INTRAVENOUS | Status: DC

## 2020-11-14 MED ORDER — MORPHINE SULFATE (PF) 4 MG/ML IV SOLN: 2.0000 mg | INTRAVENOUS | Status: DC | PRN

## 2020-11-14 MED ORDER — ALBUTEROL SULFATE (2.5 MG/3ML) 0.083% IN NEBU: 2.5000 mg | INHALATION_SOLUTION | Freq: Four times a day (QID) | RESPIRATORY_TRACT | Status: DC | PRN

## 2020-11-14 MED ORDER — TECHNETIUM TC 99M MEBROFENIN IV KIT
5.1000 | PACK | Freq: Once | INTRAVENOUS | Status: AC | PRN
  Administered 2020-11-14: 5.1 via INTRAVENOUS

## 2020-11-14 MED ORDER — ENOXAPARIN SODIUM 40 MG/0.4ML IJ SOSY
40.0000 mg | PREFILLED_SYRINGE | INTRAMUSCULAR | Status: DC
  Filled 2020-11-14: qty 0.4

## 2020-11-14 NOTE — MAU Note (Signed)
Received report from M. Shopshier,RN. Pt asleep at this time. Appears comfortable.

## 2020-11-14 NOTE — H&P (Addendum)
History and Physical    Heather Barron KDT:[id number] DOB: 1995/06/21 DOA: 11/14/2020  Referring MD/NP/PA: Midge Minium PCP: Patient, No Pcp Per (Inactive)  Patient coming from: Home  Chief Complaint: Abdominal pain  I have personally briefly reviewed patient's old medical records in  Link   HPI: Heather Barron is a 25 y.o. female who has past medical history significant for morbid obesity, obstructive sleep apnea on CPAP, preeclampsia, and is 1 month postpartum presents with complaints of abdominal pain.  She complains of having intermittent sharp epigastric pains during pregnancy, but was told was likely related to the baby move.  Pain seemed to go away after she gave birth until yesterday morning.  Patient reports that she was awakened out of her sleep yesterday morning with the epigastric abdominal pain that did not radiate.  Pain was constant lasting from approximately 2 AM to 6 AM before seeming to resolve on its own. She tried taking oxycodone and Tylenol without any relief in symptoms.  Patient is not sure if pain was correlated with food or not.  Associated symptoms included shortness of breath due to pain, nausea, and vomiting.  Yesterday afternoon pain really similar pain recurred prompting her to come to the emergency department for further evaluation.  Denies having any significant fever, chills, heartburn to her knowledge, cough, chest pain, diarrhea, or change in stool.  Her wounds from her C-section have been healing without signs of erythema or drainage.  ED Course: Upon admission to the emergency department patient was noted to be afebrile with stable vital signs.  Labs significant for globin 11.7, lipase 44, AST 97, ALT 44, total bilirubin 0.8.  Right upper quadrant ultrasound significant for  cholelithiasis with CBD within normal limits at 5.5 and positive sonographic Murphy sign concerning for acute cholecystitis.  Urinalysis did not show any significant  signs of infection.  General surgery had been formally consulted and had prescribed Pepcid which had seemed to improve symptoms initially.  Plan was to follow-up in the outpatient clinic for elective cholecystectomy.  However patient had recurrence of epigastric abdominal pain.  General surgery notified and recommended obtaining HIDA scan with medicine admission.  Patient was started on empiric antibiotics with Zosyn.  Review of Systems  Constitutional:  Negative for fever.  HENT:  Negative for congestion and nosebleeds.   Eyes:  Negative for pain and discharge.  Respiratory:  Positive for shortness of breath.   Cardiovascular:  Negative for chest pain and leg swelling.  Gastrointestinal:  Positive for nausea and vomiting. Negative for constipation, diarrhea and heartburn.  Genitourinary:  Negative for dysuria and hematuria.  Musculoskeletal:  Negative for falls.  Skin:  Negative for rash.  Neurological:  Negative for focal weakness and loss of consciousness.  Psychiatric/Behavioral:  Negative for substance abuse. The patient has insomnia.    Past Medical History:  Diagnosis Date   Headache    Patient denies medical problems    Sleep apnea    Vaginal Pap smear, abnormal     Past Surgical History:  Procedure Laterality Date   denies     NO PAST SURGERIES       reports that she has never smoked. She has never used smokeless tobacco. She reports that she does not currently use alcohol. She reports that she does not use drugs.  No Known Allergies  Family History  Problem Relation Age of Onset   Diabetes Mother    Ovarian cysts Sister    Pancreatic cancer Maternal Grandmother  Prior to Admission medications   Medication Sig Start Date End Date Taking? Authorizing Provider  famotidine (PEPCID) 40 MG tablet Take 1 tablet (40 mg total) by mouth daily. 11/13/20   Bernerd Limbo, CNM    Physical Exam:  Constitutional: Obese female who appears to be currently in no acute  distress Vitals:   11/14/20 0446  BP: 104/80  Pulse: 70  Resp: 18  Temp: 97.8 F (36.6 C)  TempSrc: Oral  SpO2: 99%   Eyes: PERRL, lids and conjunctivae normal ENMT: Mucous membranes are moist. Posterior pharynx clear of any exudate or lesions.  Neck: normal, supple, no masses, no thyromegaly Respiratory: clear to auscultation bilaterally, no wheezing, no crackles. Normal respiratory effort. No accessory muscle use.  Cardiovascular: Regular rate and rhythm, no murmurs / rubs / gallops. No extremity edema. 2+ pedal pulses.   Abdomen: No tenderness palpation noted on physical exam at this time. Musculoskeletal: no clubbing / cyanosis. No joint deformity upper and lower extremities. Good ROM, no contractures. Normal muscle tone.  Skin: no rashes, lesions, ulcers. No induration Neurologic: CN 2-12 grossly intact. Strength 5/5 in all 4.  Psychiatric: Normal judgment and insight. Alert and oriented x 3. Normal mood.     Labs on Admission: I have personally reviewed following labs and imaging studies  CBC: Recent Labs  Lab 11/13/20 1905  WBC 6.1  NEUTROABS 4.4  HGB 11.7*  HCT 38.1  MCV 84.3  PLT 295   Basic Metabolic Panel: Recent Labs  Lab 11/13/20 1905  NA 136  K 3.7  CL 102  CO2 27  GLUCOSE 95  BUN 9  CREATININE 0.80  CALCIUM 9.0   GFR: CrCl cannot be calculated (Unknown ideal weight.). Liver Function Tests: Recent Labs  Lab 11/13/20 1905  AST 97*  ALT 44  ALKPHOS 78  BILITOT 0.8  PROT 7.7  ALBUMIN 3.4*   Recent Labs  Lab 11/13/20 1905  LIPASE 44   No results for input(s): AMMONIA in the last 168 hours. Coagulation Profile: No results for input(s): INR, PROTIME in the last 168 hours. Cardiac Enzymes: No results for input(s): CKTOTAL, CKMB, CKMBINDEX, TROPONINI in the last 168 hours. BNP (last 3 results) No results for input(s): PROBNP in the last 8760 hours. HbA1C: No results for input(s): HGBA1C in the last 72 hours. CBG: No results for  input(s): GLUCAP in the last 168 hours. Lipid Profile: No results for input(s): CHOL, HDL, LDLCALC, TRIG, CHOLHDL, LDLDIRECT in the last 72 hours. Thyroid Function Tests: No results for input(s): TSH, T4TOTAL, FREET4, T3FREE, THYROIDAB in the last 72 hours. Anemia Panel: No results for input(s): VITAMINB12, FOLATE, FERRITIN, TIBC, IRON, RETICCTPCT in the last 72 hours. Urine analysis:    Component Value Date/Time   COLORURINE AMBER (A) 11/13/2020 1844   APPEARANCEUR HAZY (A) 11/13/2020 1844   LABSPEC 1.024 11/13/2020 1844   PHURINE 6.0 11/13/2020 1844   GLUCOSEU NEGATIVE 11/13/2020 1844   HGBUR NEGATIVE 11/13/2020 1844   BILIRUBINUR NEGATIVE 11/13/2020 1844   KETONESUR NEGATIVE 11/13/2020 1844   PROTEINUR 100 (A) 11/13/2020 1844   NITRITE NEGATIVE 11/13/2020 1844   LEUKOCYTESUR NEGATIVE 11/13/2020 1844   Sepsis Labs: No results found for this or any previous visit (from the past 240 hour(s)).   Radiological Exams on Admission: US ABDOMEN LIMITED RUQ (LIVER/GB)  Result Date: 11/13/2020 CLINICAL DATA:  Postpartum upper abdominal pain. EXAM: ULTRASOUND ABDOMEN LIMITED RIGHT UPPER QUADRANT COMPARISON:  None. FINDINGS: Gallbladder: Subcentimeter echogenic gallstones are suspected within the dependent portion of  the gallbladder lumen. The largest measures approximately 6 mm. There is no evidence of gallbladder wall thickening (approximately 2.0 mm). A positive sonographic Murphy's sign is noted by sonographer. Common bile duct: Diameter: 5.5 mm Liver: No focal lesion identified. Within normal limits in parenchymal echogenicity. Portal vein is patent on color Doppler imaging with normal direction of blood flow towards the liver. Other: None. IMPRESSION: Cholelithiasis in the setting of a positive sonographic Murphy's sign which is suggestive of acute cholecystitis. Further evaluation with a nuclear medicine hepatobiliary scan is recommended. Electronically Signed   By: Aram Candela M.D.    On: 11/13/2020 20:08      Assessment/Plan Abdominal pain secondary to cholelithiasis with suspected acute cholecystitis: Patient presents with complaints of epigastric abdominal pain with nausea and vomiting.  Right upper quadrant ultrasound concerning for cholelithiasis with reported positive sonographic Murphy sign concerning for acute cholecystitis.  General surgery had formally been consulted and ordered HIDA scan.  Patient has been started on empiric antibiotics of Zosyn. -Admit to a MedSurg bed -N.p.o. until after HIDA scan -Follow-up HIDA scan -Continue Zosyn -General surgery consulted, we will follow-up for any further recommendations  Transaminitis: Acute.  AST elevated at 97, but was previously noted to be 47 on 9/29.  Suspect secondary to above. -Recheck CMP tomorrow  Normocytic hypochromic anemia: Hemoglobin 11.7 g/dL which appears similar to previous.  Patient has iron supplements at home, but has not recently been taking them -Encourage patient to resume iron supplements at discharge  Suspect GERD: Patient denies any complaints of. -Continue Pepcid  OSA on CPAP -Continue CPAP nightly per respiratory therapy  Morbid obese: BMI greater than 60.26 kg/m  DVT prophylaxis: Lovenox Code Status: Full Family Communication: Patient's mother updated over the phone Disposition Plan: Home once medically stable Consults called: General surgery Admission status: Inpatient, require more than 2 midnight stay for likely need of cholecystectomy  Clydie Braun MD [facility]   If 7PM-7AM, please contact night-coverage   11/14/2020, 8:02 AM

## 2020-11-14 NOTE — Discharge Summary (Signed)
Physician Discharge Summary  MANUELITA MOXON MRN: [id number] DOB/AGE: 24-10-1995 24 y.o.  PCP: Patient, No Pcp Per (Inactive)   Admit date: 11/14/2020 Discharge date: 11/14/2020 left AGAINST MEDICAL ADVICE  Discharge Diagnoses:   Principal Problem:   Biliary dyskinesia Active Problems:   Obesity 389 lbs. BMI=64.8   Abdominal pain   Cholelithiasis   OSA on CPAP   Hypochromic anemia   Transaminitis    Follow-up recommendations -Patient left AGAINST MEDICAL ADVICE.  Recommended patient follow-up patient setting with general surgery for elective cholecystectomy  Discharge Condition: Stable   Disposition: Home    Consults: General surgery    Significant Diagnostic Studies:  NM Hepato W/EF  Result Date: 11/14/2020 CLINICAL DATA:  Epigastric abdominal pain. EXAM: NUCLEAR MEDICINE HEPATOBILIARY IMAGING WITH GALLBLADDER EF TECHNIQUE: Sequential images of the abdomen were obtained out to 60 minutes following intravenous administration of radiopharmaceutical. After oral ingestion of Ensure, gallbladder ejection fraction was determined. At 60 min, normal ejection fraction is greater than 33%. RADIOPHARMACEUTICALS:  5.1 mCi Tc-72m  Choletec IV COMPARISON:  November 13, 2020. FINDINGS: Prompt uptake and biliary excretion of activity by the liver is seen. Gallbladder activity is visualized, consistent with patency of cystic duct. Biliary activity passes into small bowel, consistent with patent common bile duct. Calculated gallbladder ejection fraction is 22%. (Normal gallbladder ejection fraction with Ensure is greater than 33%.) Patient reported no pain after Ensure administration. IMPRESSION: Gallbladder ejection fraction of 22% was measured after Ensure administration which is below normal. This is concerning for biliary dyskinesis. Electronically Signed   By: Lupita Raider M.D.   On: 11/14/2020 13:57   US ABDOMEN LIMITED RUQ (LIVER/GB)  Result Date: 11/13/2020 CLINICAL DATA:   Postpartum upper abdominal pain. EXAM: ULTRASOUND ABDOMEN LIMITED RIGHT UPPER QUADRANT COMPARISON:  None. FINDINGS: Gallbladder: Subcentimeter echogenic gallstones are suspected within the dependent portion of the gallbladder lumen. The largest measures approximately 6 mm. There is no evidence of gallbladder wall thickening (approximately 2.0 mm). A positive sonographic Murphy's sign is noted by sonographer. Common bile duct: Diameter: 5.5 mm Liver: No focal lesion identified. Within normal limits in parenchymal echogenicity. Portal vein is patent on color Doppler imaging with normal direction of blood flow towards the liver. Other: None. IMPRESSION: Cholelithiasis in the setting of a positive sonographic Murphy's sign which is suggestive of acute cholecystitis. Further evaluation with a nuclear medicine hepatobiliary scan is recommended. Electronically Signed   By: Aram Candela M.D.   On: 11/13/2020 20:08      Filed Weights   11/14/20 1416  Weight: (!) 164.2 kg     Microbiology: Recent Results (from the past 240 hour(s))  Resp Panel by RT-PCR (Flu A&B, Covid) Nasopharyngeal Swab     Status: None   Collection Time: 11/14/20  6:28 AM   Specimen: Nasopharyngeal Swab; Nasopharyngeal(NP) swabs in vial transport medium  Result Value Ref Range Status   SARS Coronavirus 2 by RT PCR NEGATIVE NEGATIVE Final    Comment: (NOTE) SARS-CoV-2 target nucleic acids are NOT DETECTED.  The SARS-CoV-2 RNA is generally detectable in upper respiratory specimens during the acute phase of infection. The lowest concentration of SARS-CoV-2 viral copies this assay can detect is 138 copies/mL. A negative result does not preclude SARS-Cov-2 infection and should not be used as the sole basis for treatment or other patient management decisions. A negative result may occur with  improper specimen collection/handling, submission of specimen other than nasopharyngeal swab, presence of viral mutation(s) within  the areas targeted by this  assay, and inadequate number of viral copies(<138 copies/mL). A negative result must be combined with clinical observations, patient history, and epidemiological information. The expected result is Negative.  Fact Sheet for Patients:  BloggerCourse.com  Fact Sheet for Healthcare Providers:  SeriousBroker.it  This test is no t yet approved or cleared by the [place] FDA and  has been authorized for detection and/or diagnosis of SARS-CoV-2 by FDA under an Emergency Use Authorization (EUA). This EUA will remain  in effect (meaning this test can be used) for the duration of the COVID-19 declaration under Section 564(b)(1) of the Act, 21 U.S.C.section 360bbb-3(b)(1), unless the authorization is terminated  or revoked sooner.       Influenza A by PCR NEGATIVE NEGATIVE Final   Influenza B by PCR NEGATIVE NEGATIVE Final    Comment: (NOTE) The Xpert Xpress SARS-CoV-2/FLU/RSV plus assay is intended as an aid in the diagnosis of influenza from Nasopharyngeal swab specimens and should not be used as a sole basis for treatment. Nasal washings and aspirates are unacceptable for Xpert Xpress SARS-CoV-2/FLU/RSV testing.  Fact Sheet for Patients: BloggerCourse.com  Fact Sheet for Healthcare Providers: SeriousBroker.it  This test is not yet approved or cleared by the [place] FDA and has been authorized for detection and/or diagnosis of SARS-CoV-2 by FDA under an Emergency Use Authorization (EUA). This EUA will remain in effect (meaning this test can be used) for the duration of the COVID-19 declaration under Section 564(b)(1) of the Act, 21 U.S.C. section 360bbb-3(b)(1), unless the authorization is terminated or revoked.  Performed at [facility], [address], [place], [place] [address]        Blood Culture No results found for: SDES,  SPECREQUEST, CULT, REPTSTATUS    Labs: Results for orders placed or performed during the hospital encounter of 11/14/20 (from the past 48 hour(s))  Resp Panel by RT-PCR (Flu A&B, Covid) Nasopharyngeal Swab     Status: None   Collection Time: 11/14/20  6:28 AM   Specimen: Nasopharyngeal Swab; Nasopharyngeal(NP) swabs in vial transport medium  Result Value Ref Range   SARS Coronavirus 2 by RT PCR NEGATIVE NEGATIVE    Comment: (NOTE) SARS-CoV-2 target nucleic acids are NOT DETECTED.  The SARS-CoV-2 RNA is generally detectable in upper respiratory specimens during the acute phase of infection. The lowest concentration of SARS-CoV-2 viral copies this assay can detect is 138 copies/mL. A negative result does not preclude SARS-Cov-2 infection and should not be used as the sole basis for treatment or other patient management decisions. A negative result may occur with  improper specimen collection/handling, submission of specimen other than nasopharyngeal swab, presence of viral mutation(s) within the areas targeted by this assay, and inadequate number of viral copies(<138 copies/mL). A negative result must be combined with clinical observations, patient history, and epidemiological information. The expected result is Negative.  Fact Sheet for Patients:  BloggerCourse.com  Fact Sheet for Healthcare Providers:  SeriousBroker.it  This test is no t yet approved or cleared by the [place] FDA and  has been authorized for detection and/or diagnosis of SARS-CoV-2 by FDA under an Emergency Use Authorization (EUA). This EUA will remain  in effect (meaning this test can be used) for the duration of the COVID-19 declaration under Section 564(b)(1) of the Act, 21 U.S.C.section 360bbb-3(b)(1), unless the authorization is terminated  or revoked sooner.       Influenza A by PCR NEGATIVE NEGATIVE   Influenza B by PCR NEGATIVE NEGATIVE     Comment: (  NOTE) The Xpert Xpress SARS-CoV-2/FLU/RSV plus assay is intended as an aid in the diagnosis of influenza from Nasopharyngeal swab specimens and should not be used as a sole basis for treatment. Nasal washings and aspirates are unacceptable for Xpert Xpress SARS-CoV-2/FLU/RSV testing.  Fact Sheet for Patients: BloggerCourse.com  Fact Sheet for Healthcare Providers: SeriousBroker.it  This test is not yet approved or cleared by the [place] FDA and has been authorized for detection and/or diagnosis of SARS-CoV-2 by FDA under an Emergency Use Authorization (EUA). This EUA will remain in effect (meaning this test can be used) for the duration of the COVID-19 declaration under Section 564(b)(1) of the Act, 21 U.S.C. section 360bbb-3(b)(1), unless the authorization is terminated or revoked.  Performed at [facility], [address] [place], [place] [address]      Lipid Panel  No results found for: CHOL, TRIG, HDL, CHOLHDL, VLDL, LDLCALC, LDLDIRECT   No results found for: HGBA1C   Lab Results  Component Value Date   CREATININE 0.80 11/13/2020     HPI :Heather Barron is a 25 y.o. female who has past medical history significant for morbid obesity, obstructive sleep apnea on CPAP, preeclampsia, and is 1 month postpartum presents with complaints of abdominal pain.  She complains of having intermittent sharp epigastric pains during pregnancy, but was told was likely related to the baby move.  Pain seemed to go away after she gave birth until yesterday morning.  Patient reports that she was awakened out of her sleep yesterday morning with the epigastric abdominal pain that did not radiate.  Pain was constant lasting from approximately 2 AM to 6 AM before seeming to resolve on its own. She tried taking oxycodone and Tylenol without any relief in symptoms.  Patient is not sure if pain was correlated with food or not.   Associated symptoms included shortness of breath due to pain, nausea, and vomiting.  Yesterday afternoon pain really similar pain recurred prompting her to come to the emergency department for further evaluation.  Denies having any significant fever, chills, heartburn to her knowledge, cough, chest pain, diarrhea, or change in stool.  Her wounds from her C-section have been healing without signs of erythema or drainage.   ED Course: Upon admission to the emergency department patient was noted to be afebrile with stable vital signs.  Labs significant for globin 11.7, lipase 44, AST 97, ALT 44, total bilirubin 0.8.  Right upper quadrant ultrasound significant for  cholelithiasis with CBD within normal limits at 5.5 and positive sonographic Murphy sign concerning for acute cholecystitis.  Urinalysis did not show any significant signs of infection.  General surgery had been formally consulted and had prescribed Pepcid which had seemed to improve symptoms initially.  Plan was to follow-up in the outpatient clinic for elective cholecystectomy.  However patient had recurrence of epigastric abdominal pain.  General surgery notified and recommended obtaining HIDA scan with medicine admission.  Patient was started on empiric antibiotics with Zosyn.     HOSPITAL COURSE:   Abdominal pain secondary to biliary dyskinesia: Patient presents with complaints of epigastric abdominal pain with nausea and vomiting.  Right upper quadrant ultrasound concerning for cholelithiasis with reported positive sonographic Murphy sign concerning for acute cholecystitis.  General surgery had formally been consulted and ordered HIDA scan.  Patient has been started on empiric antibiotics of Zosyn.  HIDA scan significant for gallbladder ejection fraction of 22% concerning for biliary dyskinesia.  General surgery had recommended observation overnight.  To  continue pain medication as needed and Pepcid.  If patient had recurrence of pain possible  surgery on 10/29 for laparoscopic cholecystectomy.  However, if stable possible discharge home to follow-up in outpatient setting for elective cholecystectomy.  Patient reported that she did not want to undergo a surgery given that she just recently had a C-section 1 month ago.  She longer wanted to be in the hospital and signed out AGAINST MEDICAL ADVICE.  Transaminitis:  AST elevated at 97, but was previously noted to be 47 on 9/29.  Possibly related with above versus fatty liver disease.  Suspected GERD: Patient was given Pepcid normal.  OSA on CPAP  Morbid obesity: BMI 60.26 kg/m   Signed: Virgal Warmuth A Cassidy Tashiro 11/14/2020, 9:00 PM        Time spent <30 mins

## 2020-11-14 NOTE — MAU Note (Signed)
IV team nurse at bedside. IV started see flow sheet.

## 2020-11-14 NOTE — Progress Notes (Signed)
Notified that the patient has returned with recurrent epigastric pain and vomiting. As ultrasound did not definitively show cholecystitis and symptoms are not quite typical of biliary colic, I recommend a HIDA scan to further evaluate for cholecystitis. Surgery team will follow up on these results.

## 2020-11-14 NOTE — MAU Note (Signed)
Pt states she took ibuprofen and oxycodone at 0100 but vomited afterwards, so she has not had any pain relief.

## 2020-11-14 NOTE — Progress Notes (Addendum)
[facility] Progress Note     Subjective: CC:  Denies pain currently. Has not eaten since HIDA scan. Confirms a history of intermittent epigastric pain and nausea during pregnancy.   Patient states she just breastfeed/pump but was told to pump and dump for 3 days after HIDA. Requesting breast pump  Objective: Vital signs in last 24 hours: Temp:  [97.7 F (36.5 C)-98.8 F (37.1 C)] 97.7 F (36.5 C) (10/28 1407) Pulse Rate:  [68-88] 73 (10/28 1407) Resp:  [17-18] 18 (10/28 1407) BP: (102-140)/(57-80) 134/73 (10/28 1407) SpO2:  [98 %-100 %] 100 % (10/28 1407) Weight:  [164.2 kg] 164.2 kg (10/28 1416)    Intake/Output from previous day: No intake/output data recorded. Intake/Output this shift: No intake/output data recorded.  PE: Gen:  Alert, NAD, pleasant Card:  Regular rate and rhythm, pedal pulses 2+ BL Pulm:  Normal effort, clear to auscultation bilaterally Abd: Soft, obese, TTP epigastrium and RUQ without guarding or peritonitis, pfannenstiel incision appears well-healing without cellulitis  Skin: warm and dry, no rashes  Psych: A&Ox3   Lab Results:  Recent Labs    11/13/20 1905  WBC 6.1  HGB 11.7*  HCT 38.1  PLT 295   BMET Recent Labs    11/13/20 1905  NA 136  K 3.7  CL 102  CO2 27  GLUCOSE 95  BUN 9  CREATININE 0.80  CALCIUM 9.0   PT/INR No results for input(s): LABPROT, INR in the last 72 hours. CMP     Component Value Date/Time   NA 136 11/13/2020 1905   K 3.7 11/13/2020 1905   CL 102 11/13/2020 1905   CO2 27 11/13/2020 1905   GLUCOSE 95 11/13/2020 1905   BUN 9 11/13/2020 1905   CREATININE 0.80 11/13/2020 1905   CALCIUM 9.0 11/13/2020 1905   PROT 7.7 11/13/2020 1905   ALBUMIN 3.4 (L) 11/13/2020 1905   AST 97 (H) 11/13/2020 1905   ALT 44 11/13/2020 1905   ALKPHOS 78 11/13/2020 1905   BILITOT 0.8 11/13/2020 1905   GFRNONAA >60 11/13/2020 1905   Lipase     Component Value Date/Time   LIPASE 44 11/13/2020 1905        Studies/Results: NM Hepato W/EF  Result Date: 11/14/2020 CLINICAL DATA:  Epigastric abdominal pain. EXAM: NUCLEAR MEDICINE HEPATOBILIARY IMAGING WITH GALLBLADDER EF TECHNIQUE: Sequential images of the abdomen were obtained out to 60 minutes following intravenous administration of radiopharmaceutical. After oral ingestion of Ensure, gallbladder ejection fraction was determined. At 60 min, normal ejection fraction is greater than 33%. RADIOPHARMACEUTICALS:  5.1 mCi Tc-83m  Choletec IV COMPARISON:  November 13, 2020. FINDINGS: Prompt uptake and biliary excretion of activity by the liver is seen. Gallbladder activity is visualized, consistent with patency of cystic duct. Biliary activity passes into small bowel, consistent with patent common bile duct. Calculated gallbladder ejection fraction is 22%. (Normal gallbladder ejection fraction with Ensure is greater than 33%.) Patient reported no pain after Ensure administration. IMPRESSION: Gallbladder ejection fraction of 22% was measured after Ensure administration which is below normal. This is concerning for biliary dyskinesis. Electronically Signed   By: Lupita Raider M.D.   On: 11/14/2020 13:57   US ABDOMEN LIMITED RUQ (LIVER/GB)  Result Date: 11/13/2020 CLINICAL DATA:  Postpartum upper abdominal pain. EXAM: ULTRASOUND ABDOMEN LIMITED RIGHT UPPER QUADRANT COMPARISON:  None. FINDINGS: Gallbladder: Subcentimeter echogenic gallstones are suspected within the dependent portion of the gallbladder lumen. The largest measures approximately 6 mm. There is no evidence of gallbladder wall thickening (  approximately 2.0 mm). A positive sonographic Murphy's sign is noted by sonographer. Common bile duct: Diameter: 5.5 mm Liver: No focal lesion identified. Within normal limits in parenchymal echogenicity. Portal vein is patent on color Doppler imaging with normal direction of blood flow towards the liver. Other: None. IMPRESSION: Cholelithiasis in the setting  of a positive sonographic Murphy's sign which is suggestive of acute cholecystitis. Further evaluation with a nuclear medicine hepatobiliary scan is recommended. Electronically Signed   By: Aram Candela M.D.   On: 11/13/2020 20:08    Anti-infectives: Anti-infectives (From admission, onward)    Start     Dose/Rate Route Frequency Ordered Stop   11/14/20 0645  piperacillin-tazobactam (ZOSYN) IVPB 3.375 g        3.375 g 12.5 mL/hr over 240 Minutes Intravenous Every 8 hours 11/14/20 1657         Assessment/Plan Epigastric pain Biliary colic, biliary dyskinesia  - history of postprandial epigastric pain that occasionally improves with H2-blocker treatment. Associated with nausea and vomiting. HIDA scan 10/28 without cystic duct obstruction but with biliary dyskinesia.  Recommend PO trial tonight. If pain recurs then I recommend laparoscopic cholecystectomy tomorrow 10/29. If able to tolerate PO and sxs controlled with H2-blocker and pain medication then it is reasonable to discharge home on low fat diet and follow up in the outpatient setting to discuss elective cholecystectomy for symptomatic gallbladder disease.   GERD  OSA on CPAP Morbid obesity  Almost 4 weeks post-partum   LOS: 0 days    Hosie Spangle, [facility] Please see Amion for pager number during day hours 7:00am-4:30pm

## 2020-11-14 NOTE — MAU Note (Signed)
Pt off floor to HIDA scan. (About 2 hours)

## 2020-11-14 NOTE — MAU Note (Signed)
..  Heather Barron is a 25 y.o. at [facility] returns to MAU reporting upper abdominal pain. States she went home ate a salad, took ibuprofen, oxy, and Pepcid and has not found relief. Reports the same is the same and it has not improved.   Pain score: 10/10

## 2020-11-14 NOTE — Progress Notes (Signed)
Pt signed AMA documentation. Dr. Katrinka Blazing aware and talk to the patient. IV discontinue.

## 2020-11-14 NOTE — MAU Provider Note (Signed)
Event Date/Time   First Provider Initiated Contact with Patient 11/14/20 0509     S [name] is a 25 y.o. G1P0001 non-pregnant female at 50wks postpartum who presents to MAU today with complaint of 10/10 mid to epigastric pain with forceful vomiting after eating. She was seen last night for this same issue, worked up for biliary colic and ultimately sent home on po meds. She went to [place], ate a salad so she could take pain meds and then threw everything up with a return of the constant pain. No obstetrical or gynecological complaints.  O BP 104/80 (BP Location: Right Arm)   Pulse 70   Temp 97.8 F (36.6 C) (Oral)   Resp 18   SpO2 99%   A&P Since Ms. Leisey was seen for this issue last night and cleared of gynecological etiology, called general surgery again (Dr. Sophronia Simas) who agreed pt needs to be admitted to Med-Surg for further evaluation of pain and vomiting. Call placed to [facility] team who agreed to put in admission orders and come to evaluate patient.  Care turned over to hospitalists.  Bernerd Limbo, [place] 11/14/2020 5:43 AM

## 2020-11-14 NOTE — MAU Note (Signed)
Back from HIDA scan. Pt stated her pain is gone.

## 2020-11-26 ENCOUNTER — Other Ambulatory Visit: Payer: Self-pay

## 2020-11-26 ENCOUNTER — Encounter (HOSPITAL_COMMUNITY): Payer: Self-pay | Admitting: Obstetrics and Gynecology

## 2020-11-26 ENCOUNTER — Emergency Department (HOSPITAL_COMMUNITY)
Admission: AD | Admit: 2020-11-26 | Discharge: 2020-11-26 | Disposition: A | Discharge status: Home / Self Care | Payer: BC Managed Care – PPO | Attending: Obstetrics and Gynecology | Admitting: Obstetrics and Gynecology

## 2020-11-26 DIAGNOSIS — R739 Hyperglycemia, unspecified: Secondary | ICD-10-CM | POA: Diagnosis not present

## 2020-11-26 DIAGNOSIS — R112 Nausea with vomiting, unspecified: Secondary | ICD-10-CM | POA: Insufficient documentation

## 2020-11-26 DIAGNOSIS — K8050 Calculus of bile duct without cholangitis or cholecystitis without obstruction: Secondary | ICD-10-CM

## 2020-11-26 DIAGNOSIS — D649 Anemia, unspecified: Secondary | ICD-10-CM | POA: Insufficient documentation

## 2020-11-26 DIAGNOSIS — Z20822 Contact with and (suspected) exposure to covid-19: Secondary | ICD-10-CM | POA: Insufficient documentation

## 2020-11-26 DIAGNOSIS — J45909 Unspecified asthma, uncomplicated: Secondary | ICD-10-CM | POA: Insufficient documentation

## 2020-11-26 DIAGNOSIS — R1013 Epigastric pain: Secondary | ICD-10-CM | POA: Insufficient documentation

## 2020-11-26 LAB — CBC WITH DIFFERENTIAL/PLATELET
Abs Immature Granulocytes: 0.02 10*3/uL (ref 0.00–0.07)
Basophils Absolute: 0 10*3/uL (ref 0.0–0.1)
Basophils Relative: 0 %
Eosinophils Absolute: 0.1 10*3/uL (ref 0.0–0.5)
Eosinophils Relative: 1 %
HCT: 35.2 % — ABNORMAL LOW (ref 36.0–46.0)
Hemoglobin: 10.9 g/dL — ABNORMAL LOW (ref 12.0–15.0)
Immature Granulocytes: 0 %
Lymphocytes Relative: 19 %
Lymphs Abs: 1.5 10*3/uL (ref 0.7–4.0)
MCH: 26 pg (ref 26.0–34.0)
MCHC: 31 g/dL (ref 30.0–36.0)
MCV: 84 fL (ref 80.0–100.0)
Monocytes Absolute: 0.5 10*3/uL (ref 0.1–1.0)
Monocytes Relative: 7 %
Neutro Abs: 5.5 10*3/uL (ref 1.7–7.7)
Neutrophils Relative %: 73 %
Platelets: 289 10*3/uL (ref 150–400)
RBC: 4.19 MIL/uL (ref 3.87–5.11)
RDW: 13.8 % (ref 11.5–15.5)
WBC: 7.6 10*3/uL (ref 4.0–10.5)
nRBC: 0 % (ref 0.0–0.2)

## 2020-11-26 LAB — RESP PANEL BY RT-PCR (FLU A&B, COVID) ARPGX2
Influenza A by PCR: NEGATIVE
Influenza B by PCR: NEGATIVE
SARS Coronavirus 2 by RT PCR: NEGATIVE

## 2020-11-26 LAB — URINALYSIS, ROUTINE W REFLEX MICROSCOPIC
Bacteria, UA: NONE SEEN
Bilirubin Urine: NEGATIVE
Glucose, UA: NEGATIVE mg/dL
Hgb urine dipstick: NEGATIVE
Ketones, ur: NEGATIVE mg/dL
Leukocytes,Ua: NEGATIVE
Nitrite: NEGATIVE
Protein, ur: 100 mg/dL — AB
Specific Gravity, Urine: 1.017 (ref 1.005–1.030)
pH: 6 (ref 5.0–8.0)

## 2020-11-26 LAB — COMPREHENSIVE METABOLIC PANEL
ALT: 45 U/L — ABNORMAL HIGH (ref 0–44)
AST: 106 U/L — ABNORMAL HIGH (ref 15–41)
Albumin: 3.3 g/dL — ABNORMAL LOW (ref 3.5–5.0)
Alkaline Phosphatase: 92 U/L (ref 38–126)
Anion gap: 10 (ref 5–15)
BUN: 8 mg/dL (ref 6–20)
CO2: 26 mmol/L (ref 22–32)
Calcium: 8.9 mg/dL (ref 8.9–10.3)
Chloride: 101 mmol/L (ref 98–111)
Creatinine, Ser: 0.79 mg/dL (ref 0.44–1.00)
GFR, Estimated: >60 mL/min (ref >60)
Glucose, Bld: 103 mg/dL — ABNORMAL HIGH (ref 70–99)
Potassium: 3.9 mmol/L (ref 3.5–5.1)
Sodium: 137 mmol/L (ref 135–145)
Total Bilirubin: 0.7 mg/dL (ref 0.3–1.2)
Total Protein: 7.6 g/dL (ref 6.5–8.1)

## 2020-11-26 LAB — LIPASE, BLOOD: Lipase: 50 U/L (ref 11–51)

## 2020-11-26 MED ORDER — ONDANSETRON HCL 4 MG/2ML IJ SOLN
4.0000 mg | Freq: Once | INTRAMUSCULAR | Status: AC
  Administered 2020-11-26: 4 mg via INTRAVENOUS
  Filled 2020-11-26: qty 2

## 2020-11-26 MED ORDER — TRAMADOL HCL 50 MG PO TABS: 50.0000 mg | ORAL_TABLET | Freq: Four times a day (QID) | ORAL | 0 refills | Status: AC | PRN

## 2020-11-26 MED ORDER — ONDANSETRON HCL 4 MG PO TABS: 4.0000 mg | ORAL_TABLET | Freq: Three times a day (TID) | ORAL | 1 refills | Status: AC | PRN

## 2020-11-26 MED ORDER — FENTANYL CITRATE PF 50 MCG/ML IJ SOSY
50.0000 ug | PREFILLED_SYRINGE | Freq: Once | INTRAMUSCULAR | Status: AC
  Administered 2020-11-26: 50 ug via INTRAVENOUS
  Filled 2020-11-26: qty 1

## 2020-11-26 NOTE — Consult Note (Signed)
Consult Note  Heather Barron May 06, 1995  [id number].    Requesting MD: Arthor Captain PA-C Chief Complaint/Reason for Consult: Cholelithiasis HPI:  Patient is a 25 year old female who presented to [facility] with epigastric abdominal pain, nausea and vomiting since Midnight. Patient was previously seen by our service for biliary colic 10/28. Patient underwent HIDA 10/28 that showed biliary dyskinesia but no cholecystitis. Her symptoms this time were abrupt onset and have since resolved. She reports having a stouffer's microwave meal for dinner prior to symptoms. She is ~6 weeks post-partum and s/p cesarean section. She denies fever, chills, chest pain, SOB, urinary symptoms, diarrhea, constipation. PMH otherwise significant for OSA and morbid obesity. Recent c-section is only prior abdominal surgery. She is not on any blood thinners at home and has NKDA.   ROS: Review of Systems  Constitutional:  Negative for chills and fever.  Respiratory:  Negative for shortness of breath and wheezing.   Cardiovascular:  Negative for chest pain and palpitations.  Gastrointestinal:  Positive for abdominal pain, nausea and vomiting. Negative for blood in stool, constipation, diarrhea and melena.  Genitourinary:  Negative for dysuria, frequency and urgency.  All other systems reviewed and are negative.  Family History  Problem Relation Age of Onset   Diabetes Mother    Ovarian cysts Sister    Pancreatic cancer Maternal Grandmother     Past Medical History:  Diagnosis Date   Headache    Patient denies medical problems    Sleep apnea    Vaginal Pap smear, abnormal     Past Surgical History:  Procedure Laterality Date   CESAREAN SECTION N/A    denies     NO PAST SURGERIES      Social History:  reports that she has never smoked. She has never used smokeless tobacco. She reports that she does not currently use alcohol. She reports that she does not use drugs.  Allergies: No Known  Allergies  (Not in a hospital admission)   Blood pressure 134/79, pulse 92, temperature 97.8 F (36.6 C), temperature source Oral, resp. rate 18, height 5\' 5"  (1.651 m), weight (!) 166 kg, SpO2 96 %, currently breastfeeding. Physical Exam:  General: pleasant, WD, morbidly obese female who is laying in bed in NAD HEENT: head is normocephalic, atraumatic.  Sclera are anicteric.  Ears and nose without any masses or lesions.  Mouth is pink and moist Heart: regular, rate, and rhythm.  Normal s1,s2. No obvious murmurs, gallops, or rubs noted.  Palpable radial and pedal pulses bilaterally Lungs: CTAB, no wheezes, rhonchi, or rales noted.  Respiratory effort nonlabored Abd: soft, NT, ND, +BS, no masses, hernias, or organomegaly MS: all 4 extremities are symmetrical with no cyanosis, clubbing, or edema. Skin: warm and dry with no masses, lesions, or rashes Neuro: Cranial nerves 2-12 grossly intact, sensation is normal throughout Psych: A&Ox3 with an appropriate affect.   Results for orders placed or performed during the hospital encounter of 11/26/20 (from the past 48 hour(s))  CBC with Differential/Platelet     Status: Abnormal   Collection Time: 11/26/20  6:28 AM  Result Value Ref Range   WBC 7.6 4.0 - 10.5 K/uL   RBC 4.19 3.87 - 5.11 MIL/uL   Hemoglobin 10.9 (L) 12.0 - 15.0 g/dL   HCT 13/09/22 (L) 27.0 - 62.3 %   MCV 84.0 80.0 - 100.0 fL   MCH 26.0 26.0 - 34.0 pg   MCHC 31.0 30.0 - 36.0 g/dL  RDW 13.8 11.5 - 15.5 %   Platelets 289 150 - 400 K/uL   nRBC 0.0 0.0 - 0.2 %   Neutrophils Relative % 73 %   Neutro Abs 5.5 1.7 - 7.7 K/uL   Lymphocytes Relative 19 %   Lymphs Abs 1.5 0.7 - 4.0 K/uL   Monocytes Relative 7 %   Monocytes Absolute 0.5 0.1 - 1.0 K/uL   Eosinophils Relative 1 %   Eosinophils Absolute 0.1 0.0 - 0.5 K/uL   Basophils Relative 0 %   Basophils Absolute 0.0 0.0 - 0.1 K/uL   Immature Granulocytes 0 %   Abs Immature Granulocytes 0.02 0.00 - 0.07 K/uL    Comment: Performed  at [facility], [address], [place], [place] [address]  Comprehensive metabolic panel     Status: Abnormal   Collection Time: 11/26/20  6:28 AM  Result Value Ref Range   Sodium 137 135 - 145 mmol/L   Potassium 3.9 3.5 - 5.1 mmol/L   Chloride 101 98 - 111 mmol/L   CO2 26 22 - 32 mmol/L   Glucose, Bld 103 (H) 70 - 99 mg/dL    Comment: Glucose reference range applies only to samples taken after fasting for at least 8 hours.   BUN 8 6 - 20 mg/dL   Creatinine, Ser 8.14 0.44 - 1.00 mg/dL   Calcium 8.9 8.9 - 48.1 mg/dL   Total Protein 7.6 6.5 - 8.1 g/dL   Albumin 3.3 (L) 3.5 - 5.0 g/dL   AST 856 (H) 15 - 41 U/L   ALT 45 (H) 0 - 44 U/L   Alkaline Phosphatase 92 38 - 126 U/L   Total Bilirubin 0.7 0.3 - 1.2 mg/dL   GFR, Estimated >31 >49 mL/min    Comment: (NOTE) Calculated using the CKD-EPI Creatinine Equation (2021)    Anion gap 10 5 - 15    Comment: Performed at [facility], [address], [place], [place] [address]  Lipase, blood     Status: None   Collection Time: 11/26/20  6:28 AM  Result Value Ref Range   Lipase 50 11 - 51 U/L    Comment: Performed at [facility], [address], [place], [place] [address]   No results found.    Assessment/Plan Biliary colic  - patient currently with resolution in presenting symptoms - no leukocytosis and VSS - no ttp or positive Murphy sign on exam - given recent surgery would recommend low fat diet and symptom management with outpatient follow up to schedule elective cholecystectomy once she is further out from cesarean - I have scheduled follow up in [facility] office and provided Rx for tramadol with 0 refills and zofran with 0 refills - patient understands plan and is in agreement with this  Juliet Rude, [facility] 11/26/2020, 9:03 AM Please see Amion for pager number during day hours 7:00am-4:30pm

## 2020-11-26 NOTE — Discharge Instructions (Signed)
Contact a doctor if: You have sudden pain in the upper right part of your belly. Pain might spread to your right shoulder, back, or chest. You have been diagnosed with gallstones that have no symptoms and you get: Belly pain. Discomfort, burning, or fullness in the upper part of your abdomen. You have dark urine or pale stools. Get help right away if: You have sudden pain in the upper right part of your abdomen, and the pain lasts more than 2 hours. You have pain in your abdomen, and: It lasts more than 5 hours. It keeps getting worse. You have a fever or chills. You keep feeling like you may vomit. You keep vomiting. Your skin or the white parts of your eyes turn yellow.

## 2020-11-26 NOTE — ED Notes (Signed)
Pt arrives as transfer from MAU c/o gallstones. Pt had a c-section 6 weeks ago and was diagnosed with gallstones 2 weeks ago. Pain started around midnight, 9/10 constant pain at this time. A/o x4, VS WNL.

## 2020-11-26 NOTE — ED Provider Notes (Signed)
[facility] EMERGENCY DEPARTMENT Provider Note   CSN: [id number] Arrival date & time: 11/26/20  0608     History Chief Complaint  Patient presents with   Abdominal Pain    Heather Barron is a 25yo female with a history of cholelithiasis who presents to the ED for abdominal pain x 7 hours. Pt is s/p c-section x 6 weeks and was diagnosed with cholelithiasis on 11/13/20. Pt states she initially refused surgery and has not had recurrent sxs until last night. Pt mentions 9/10 sharp upper abdominal pain that does not radiate. She states it began at 12am and initially resolved, but then woke her up around 2 am. Pt admits to nausea and has vomited twice. She denies any fever, diarrhea, recent sick contacts/illnesses, or concerns for issues with her incision site. Pt has not tried any otc medications for her pain.   The history is provided by the patient. No language interpreter was used.  Abdominal Pain Pain location:  Epigastric Pain quality: aching, cramping and pressure   Pain radiates to:  Does not radiate Pain severity:  Severe Onset quality:  Sudden Duration:  7 hours Timing:  Constant Progression:  Worsening Chronicity:  Recurrent Context: awakening from sleep   Relieved by:  Nothing Worsened by:  Eating Ineffective treatments:  None tried Associated symptoms: nausea and vomiting   Associated symptoms: no anorexia, no belching, no chest pain, no chills, no constipation, no cough, no diarrhea and no fever   Risk factors: obesity  Pregnant now: recent pregnancy.     Past Medical History:  Diagnosis Date   Headache    Patient denies medical problems    Sleep apnea    Vaginal Pap smear, abnormal     Patient Active Problem List   Diagnosis Date Noted   Abdominal pain 11/14/2020   Cholelithiasis 11/14/2020   OSA on CPAP 11/14/2020   Hypochromic anemia 11/14/2020   Transaminitis 11/14/2020   Biliary dyskinesia 11/14/2020   Severe preeclampsia, third  trimester 10/15/2020   Obesity 389 lbs. BMI=64.8 06/27/2019   Depression 06/27/2019   Hidradenitis suppurativa 06/27/2019   History of abnormal pap LSIL 08/24/2017 with no f/u 06/27/2019   Smoker cigars 06/27/2019   Asthma 06/27/2019    Past Surgical History:  Procedure Laterality Date   denies     NO PAST SURGERIES       OB History     Gravida  1   Para  1   Term  0   Preterm  0   AB  0   Living  1      SAB  0   IAB  0   Ectopic  0   Multiple  0   Live Births  0           Family History  Problem Relation Age of Onset   Diabetes Mother    Ovarian cysts Sister    Pancreatic cancer Maternal Grandmother     Social History   Tobacco Use   Smoking status: Never   Smokeless tobacco: Never  Vaping Use   Vaping Use: Never used  Substance Use Topics   Alcohol use: Not Currently    Comment: occasionally   Drug use: Never    Home Medications Prior to Admission medications   Medication Sig Start Date End Date Taking? Authorizing Provider  docusate sodium (COLACE) 100 MG capsule Take 100 mg by mouth daily as needed for mild constipation. 10/19/20   [provider]  famotidine (PEPCID) 40 MG tablet Take 1 tablet (40 mg total) by mouth daily. Patient not taking: Reported on 11/14/2020 11/13/20   Bernerd Limbo, CNM  ibuprofen (ADVIL) 800 MG tablet Take 800 mg by mouth daily as needed for mild pain. 10/20/20   [provider]  oxyCODONE (OXY IR/ROXICODONE) 5 MG immediate release tablet Take 5 mg by mouth every 6 (six) hours as needed for moderate pain. 10/19/20   [provider]    Allergies    Patient has no known allergies.  Review of Systems   Review of Systems  Constitutional:  Positive for activity change and appetite change. Negative for chills and fever.  HENT: Negative.    Respiratory:  Negative for cough.   Cardiovascular:  Negative for chest pain.  Gastrointestinal:  Positive for abdominal pain, nausea and  vomiting. Negative for abdominal distention, anorexia, constipation and diarrhea.  Genitourinary: Negative.   Musculoskeletal: Negative.    Physical Exam Updated Vital Signs BP (!) 144/86   Pulse 72   Temp (!) 97.4 F (36.3 C)   Resp 20   Ht 5\' 5"  (1.651 m)   Wt (!) 166 kg   SpO2 100%   Breastfeeding Yes   BMI 60.91 kg/m   Physical Exam Vitals and nursing note reviewed.  Constitutional:      General: She is not in acute distress.    Appearance: She is well-developed. She is not diaphoretic.  HENT:     Head: Normocephalic and atraumatic.     Right Ear: External ear normal.     Left Ear: External ear normal.     Nose: Nose normal.     Mouth/Throat:     Mouth: Mucous membranes are moist.  Eyes:     General: No scleral icterus.    Conjunctiva/sclera: Conjunctivae normal.  Cardiovascular:     Rate and Rhythm: Normal rate and regular rhythm.     Heart sounds: Normal heart sounds. No murmur heard.   No friction rub. No gallop.  Pulmonary:     Effort: Pulmonary effort is normal. No respiratory distress.     Breath sounds: Normal breath sounds.  Abdominal:     General: Bowel sounds are normal. There is no distension.     Palpations: Abdomen is soft. There is no mass.     Tenderness: There is abdominal tenderness in the epigastric area. There is no guarding. Positive signs include Murphy's sign.  Musculoskeletal:     Cervical back: Normal range of motion.  Skin:    General: Skin is warm and dry.  Neurological:     Mental Status: She is alert and oriented to person, place, and time.  Psychiatric:        Behavior: Behavior normal.    ED Results / Procedures / Treatments   Labs (all labs ordered are listed, but only abnormal results are displayed) Labs Reviewed  CBC WITH DIFFERENTIAL/PLATELET - Abnormal; Notable for the following components:      Result Value   Hemoglobin 10.9 (*)    HCT 35.2 (*)    All other components within normal limits  COMPREHENSIVE METABOLIC  PANEL - Abnormal; Notable for the following components:   Glucose, Bld 103 (*)    Albumin 3.3 (*)    AST 106 (*)    ALT 45 (*)    All other components within normal limits  LIPASE, BLOOD  URINALYSIS, ROUTINE W REFLEX MICROSCOPIC    EKG None  Radiology No results found.  Procedures Procedures  Medications Ordered in ED Medications - No data to display  ED Course  I have reviewed the triage vital signs and the nursing notes.  Pertinent labs & imaging results that were available during my care of the patient were reviewed by me and considered in my medical decision making (see chart for details).    MDM Rules/Calculators/A&P                           25 year old female here with recurrent symptoms of biliary colic.  I have reviewed the patient's labs.  CBC shows mild anemia, urinalysis negative for infection, CMP with mildly elevated glucose, persistently elevated AST and ALT likely secondary to some fatty liver infiltration.  Patient pain controlled with IV medication. Patient seen in consultation by PA Kerrie Pleasure with the surgery service.  As her pain is currently controlled and she does not have any signs of acute cholecystitis patient has opted to follow out with Dr. Sheliah Hatch.  She has not scheduled appointment in the next couple weeks.  Patient written for pain and nausea control by PA Hollice Espy.  Patient is comfortable with plan to discharge at this time.  Discussed outpatient follow-up and return precautions. Final Clinical Impression(s) / ED Diagnoses Final diagnoses:  None    Rx / DC Orders ED Discharge Orders     None        Arthor Captain, PA-C 11/26/20 1006    Cur[place]olo, Adam, DO 11/26/20 1055

## 2020-11-26 NOTE — MAU Provider Note (Addendum)
Event Date/Time   First Provider Initiated Contact with Patient 11/26/20 0557      S [name] is a 25 y.o. G1P0001 who si 5.5 wks postpartum from C/S for severe PEC patient who presents to MAU today with complaint of upper abdominal pain like she usually has with known gallstones. She was seen in MAU on 11/14/20 for the same complaint. Her care was transferred to the [facility] and she was admitted to [facility]. She states she left before having a cholecystectomy. She states she last ate at 2000. Her pain started tonight around midnight and she also has N/V.  O BP 110/70 (BP Location: Right Arm)   Pulse 77   Temp (!) 97.4 F (36.3 C)   Resp 20   Ht 5\' 5"  (1.651 m)   Wt (!) 166 kg   SpO2 100%   Breastfeeding Yes   BMI 60.91 kg/m  Physical Exam Vitals and nursing note reviewed.  Constitutional:      Appearance: She is well-developed. She is obese.  Pulmonary:     Effort: Pulmonary effort is normal.  Skin:    General: Skin is warm and dry.  Neurological:     Mental Status: She is alert and oriented to person, place, and time.  Psychiatric:        Mood and Affect: Mood is anxious.        Behavior: Behavior normal.    A Medical screening exam complete Biliary Gallstones  P Discharge from MAU in stable condition *Consult with Dr. @ [phone] - notified of patient's complaints, assessments, would like to transfer to [facility] for further evaluation - ok to transfer to [facility] Patient given the option of transfer to [facility] for further evaluation -- patient agrees Patient may return to MAU as needed for pregnancy or directly related to PP problems up 6 weeks; which will be 11/27/2020 Patient verbalized an understanding of the plan of care and agrees.   13/10/2020, CNM 11/26/2020 5:58 AM

## 2020-11-26 NOTE — MAU Note (Signed)
Hx gallstones. Having pain upper abd which pt states feels like the usual gallbladder pain. Had primary c/s 9/29 due to preeclampsia. Started having pain around 62mn. Ate around 2000. Also having n/v.

## 2020-11-26 NOTE — MAU Note (Signed)
Brittney RN CN [facility] notified of pt's assessment and plan for transfer from MAU pp with hx of gallstones.

## 2021-10-05 ENCOUNTER — Other Ambulatory Visit (HOSPITAL_BASED_OUTPATIENT_CLINIC_OR_DEPARTMENT_OTHER): Payer: Self-pay

## 2021-10-05 ENCOUNTER — Encounter (HOSPITAL_BASED_OUTPATIENT_CLINIC_OR_DEPARTMENT_OTHER): Payer: Self-pay

## 2021-10-05 MED ORDER — SAXENDA 18 MG/3ML ~~LOC~~ SOPN
PEN_INJECTOR | SUBCUTANEOUS | 0 refills | Status: DC
  Filled 2021-10-05: qty 15, 30d supply, fill #0

## 2021-10-06 ENCOUNTER — Other Ambulatory Visit (HOSPITAL_BASED_OUTPATIENT_CLINIC_OR_DEPARTMENT_OTHER): Payer: Self-pay

## 2021-10-08 ENCOUNTER — Other Ambulatory Visit (HOSPITAL_BASED_OUTPATIENT_CLINIC_OR_DEPARTMENT_OTHER): Payer: Self-pay

## 2021-10-08 MED ORDER — INSULIN PEN NEEDLE 31G X 6 MM MISC
11 refills | Status: DC
  Filled 2021-10-08: qty 100, 90d supply, fill #0

## 2021-10-09 ENCOUNTER — Other Ambulatory Visit (HOSPITAL_BASED_OUTPATIENT_CLINIC_OR_DEPARTMENT_OTHER): Payer: Self-pay

## 2021-10-13 ENCOUNTER — Other Ambulatory Visit (HOSPITAL_BASED_OUTPATIENT_CLINIC_OR_DEPARTMENT_OTHER): Payer: Self-pay

## 2021-10-23 ENCOUNTER — Other Ambulatory Visit (HOSPITAL_BASED_OUTPATIENT_CLINIC_OR_DEPARTMENT_OTHER): Payer: Self-pay

## 2021-12-30 ENCOUNTER — Emergency Department
Admission: EM | Admit: 2021-12-30 | Discharge: 2021-12-30 | Disposition: A | Discharge status: Home / Self Care | Payer: BC Managed Care – PPO | Attending: Emergency Medicine | Admitting: Emergency Medicine

## 2021-12-30 DIAGNOSIS — Z1152 Encounter for screening for COVID-19: Secondary | ICD-10-CM | POA: Insufficient documentation

## 2021-12-30 DIAGNOSIS — R059 Cough, unspecified: Secondary | ICD-10-CM | POA: Diagnosis present

## 2021-12-30 DIAGNOSIS — J101 Influenza due to other identified influenza virus with other respiratory manifestations: Secondary | ICD-10-CM | POA: Insufficient documentation

## 2021-12-30 LAB — RESP PANEL BY RT-PCR (RSV, FLU A&B, COVID)  RVPGX2
Influenza A by PCR: NEGATIVE
Influenza B by PCR: POSITIVE — AB
Resp Syncytial Virus by PCR: NEGATIVE
SARS Coronavirus 2 by RT PCR: NEGATIVE

## 2021-12-30 LAB — GROUP A STREP BY PCR: Group A Strep by PCR: NOT DETECTED

## 2021-12-30 MED ORDER — HYDROCOD POLI-CHLORPHE POLI ER 10-8 MG/5ML PO SUER
5.0000 mL | Freq: Once | ORAL | Status: AC
  Administered 2021-12-30: 5 mL via ORAL
  Filled 2021-12-30: qty 5

## 2021-12-30 MED ORDER — GUAIFENESIN-CODEINE 100-10 MG/5ML PO SYRP
10.0000 mL | ORAL_SOLUTION | Freq: Three times a day (TID) | ORAL | 0 refills | Status: DC | PRN
Start: 2021-12-30 — End: 2022-01-06

## 2021-12-30 NOTE — ED Triage Notes (Signed)
Ambulatory to triage with c/o cough, runny nose and sore throat. Denies fever, nausea or vomiting.

## 2021-12-30 NOTE — ED Provider Notes (Signed)
[facility] Provider Note    Event Date/Time   First MD Initiated Contact with Patient 12/30/21 2129     (approximate)   History   No chief complaint on file.   HPI  Heather Barron is a 26 y.o. female with history of asthma and as listed in the EMR presents to the emergency department for treatment and evaluation of cough, rhinorrhea, sore throat that started this morning.  She has had no fever, nausea, vomiting, or diarrhea.      Physical Exam   Triage Vital Signs: ED Triage Vitals [12/30/21 2006]  Enc Vitals Group     BP 115/75     Pulse Rate 91     Resp 19     Temp 99.5 F (37.5 C)     Temp Source Oral     SpO2 99 %     Weight (!) 370 lb (167.8 kg)     Height 5\' 5"  (1.651 m)     Head Circumference      Peak Flow      Pain Score      Pain Loc      Pain Edu?      Excl. in [place]?     Most recent vital signs: Vitals:   12/30/21 2006  BP: 115/75  Pulse: 91  Resp: 19  Temp: 99.5 F (37.5 C)  SpO2: 99%     General: Awake, no distress.  CV:  Good peripheral perfusion.  Resp:  Normal effort.  Breath sounds clear to auscultation Abd:  No distention.  Other:     ED Results / Procedures / Treatments   Labs (all labs ordered are listed, but only abnormal results are displayed) Labs Reviewed  RESP PANEL BY RT-PCR (RSV, FLU A&B, COVID)  RVPGX2 - Abnormal; Notable for the following components:      Result Value   Influenza B by PCR POSITIVE (*)    All other components within normal limits  GROUP A STREP BY PCR     EKG     RADIOLOGY  Not indicated   PROCEDURES:  Critical Care performed: No  Procedures   MEDICATIONS ORDERED IN ED: Medications  chlorpheniramine-HYDROcodone (TUSSIONEX) 10-8 MG/5ML suspension 5 mL (has no administration in time range)     IMPRESSION / MDM / ASSESSMENT AND PLAN / ED COURSE  I reviewed the triage vital signs and the nursing notes.                              Differential  diagnosis includes, but is not limited to, COVID, influenza, RSV, viral syndrome, strep throat  Patient's presentation is most consistent with acute complicated illness / injury requiring diagnostic workup.  26 year old female presenting to the emergency department for treatment and evaluation of cough, rhinorrhea, sore throat that started this morning.  See HPI for further details.  While awaiting ER room assignment, respiratory panel was collected.  She is influenza B positive.  Strep screen is negative.  Plan will be to treat her with Robitussin [facility] for her cough and have her follow-up with her primary care provider if not improving over the week.  She was encouraged to take Tylenol or ibuprofen if needed for body aches or fever.  Work excuse provided for the next few days as she was advised to stay home until she has been fever free for 24 hours without medication.  FINAL CLINICAL IMPRESSION(S) / ED DIAGNOSES   Final diagnoses:  Influenza B     Rx / DC Orders   ED Discharge Orders          Ordered    guaiFENesin-codeine (ROBITUSSIN AC) 100-10 MG/5ML syrup  3 times daily PRN        12/30/21 2143    Ambulatory Referral to Primary Care        12/30/21 2146             Note:  This document was prepared using Dragon voice recognition software and may include unintentional dictation errors.   Chinita Pester, FNP 12/30/21 2156    Minna Antis, MD 12/30/21 2235

## 2022-01-06 ENCOUNTER — Ambulatory Visit (INDEPENDENT_AMBULATORY_CARE_PROVIDER_SITE_OTHER): Payer: BC Managed Care – PPO | Admitting: Family Medicine

## 2022-01-06 ENCOUNTER — Other Ambulatory Visit (HOSPITAL_BASED_OUTPATIENT_CLINIC_OR_DEPARTMENT_OTHER): Payer: Self-pay

## 2022-01-06 ENCOUNTER — Other Ambulatory Visit: Payer: Self-pay | Admitting: Family Medicine

## 2022-01-06 ENCOUNTER — Encounter: Payer: Self-pay | Admitting: Family Medicine

## 2022-01-06 ENCOUNTER — Telehealth: Payer: Self-pay | Admitting: Family Medicine

## 2022-01-06 MED ORDER — ZEPBOUND 2.5 MG/0.5ML ~~LOC~~ SOAJ
2.5000 mg | SUBCUTANEOUS | 0 refills | Status: AC
  Filled 2022-01-06 – 2022-02-05 (×4): qty 2, 28d supply, fill #0

## 2022-01-06 MED ORDER — ZEPBOUND 5 MG/0.5ML ~~LOC~~ SOAJ
5.0000 mg | SUBCUTANEOUS | 0 refills | Status: AC
  Filled 2022-01-06 – 2022-03-29 (×2): qty 2, 28d supply, fill #0

## 2022-01-06 MED ORDER — ZEPBOUND 7.5 MG/0.5ML ~~LOC~~ SOAJ: 7.5000 mg | SUBCUTANEOUS | 0 refills | Status: DC

## 2022-01-06 MED ORDER — ZEPBOUND 7.5 MG/0.5ML ~~LOC~~ SOAJ
7.5000 mg | SUBCUTANEOUS | 0 refills | Status: AC
  Filled 2022-01-06 – 2022-08-20 (×2): qty 2, 28d supply, fill #0

## 2022-01-06 MED ORDER — ZEPBOUND 5 MG/0.5ML ~~LOC~~ SOAJ: 5.0000 mg | SUBCUTANEOUS | 0 refills | Status: DC

## 2022-01-06 MED ORDER — ZEPBOUND 2.5 MG/0.5ML ~~LOC~~ SOAJ: 2.5000 mg | SUBCUTANEOUS | 0 refills | Status: DC

## 2022-01-06 NOTE — Progress Notes (Signed)
Chief Complaint  Patient presents with   New Patient (Initial Visit)    Weight Loss        New Patient Visit SUBJECTIVE: HPI: Heather Barron is an 26 y.o.female who is being seen for establishing care.  The patient was previously seen at another office.   Has tried metformin and Saxenda in the past. Also tried Weight Watchers, eating a calorie deficit. Diet could be better. Tries to stay active at work. Failed Topamax as well. She was referred to bariatric surgery but then found out she was pregnant and did not proceed. She is not diabetic.   Past Medical History:  Diagnosis Date   Sleep apnea    on CPAP   Past Surgical History:  Procedure Laterality Date   CESAREAN SECTION N/A    CHOLESTEATOMA EXCISION  10/2020   Family History  Problem Relation Age of Onset   Diabetes Mother    Ovarian cysts Sister    Pancreatic cancer Maternal Grandmother    Breast cancer Neg Hx    Colon cancer Neg Hx    Cervical cancer Neg Hx    Heart disease Neg Hx    No Known Allergies  Current Outpatient Medications:    tirzepatide (ZEPBOUND) 2.5 MG/0.5ML Pen, Inject 2.5 mg into the skin once a week for 28 days., Disp: 2 mL, Rfl: 0   [START ON 02/03/2022] tirzepatide (ZEPBOUND) 5 MG/0.5ML Pen, Inject 5 mg into the skin once a week for 28 days., Disp: 2 mL, Rfl: 0   [START ON 03/03/2022] tirzepatide (ZEPBOUND) 7.5 MG/0.5ML Pen, Inject 7.5 mg into the skin once a week for 28 days., Disp: 2 mL, Rfl: 0  OBJECTIVE: BP 118/72 (BP Location: Left Arm, Patient Position: Sitting, Cuff Size: Large)   Pulse 77   Temp 98.2 F (36.8 C) (Oral)   Ht 5\' 4"  (1.626 m)   Wt (!) 391 lb 4 oz (177.5 kg)   SpO2 98%   BMI 67.16 kg/m  General:  well developed, well nourished, in no apparent distress Skin:  no significant moles, warts, or growths Nose:  nares patent, septum midline, mucosa normal Throat/Pharynx:  lips and gingiva without lesion; tongue and uvula midline; non-inflamed pharynx; no exudates or  postnasal drainage Lungs:  clear to auscultation, breath sounds equal bilaterally, no respiratory distress Cardio:  regular rate and rhythm, no LE edema or bruits Musculoskeletal:  symmetrical muscle groups noted without atrophy or deformity Neuro:  gait normal Psych: well oriented with normal range of affect and appropriate judgment/insight  ASSESSMENT/PLAN: Morbid obesity (HCC) - Plan: Amb Ref to Medical Weight Management, Start Zepbound  Chronic, unstable. Start Zepbound 2.5 mg/week for 4 weeks, then 5 mg/week for 4 weeks and then 7.5 mg/week for 4 weeks. F/u in 6 weeks. Refer [facility] team. Counseled on diet/exercise.  The patient voiced understanding and agreement to the plan.  [place], DO 01/06/22  4:49 PM

## 2022-01-06 NOTE — Patient Instructions (Addendum)
Don't get pregnant on this medication.   Keep the diet clean and stay active.  Let me know if there are cost issues.   Aim to do some physical exertion for 150 minutes per week. This is typically divided into 5 days per week, 30 minutes per day. The activity should be enough to get your heart rate up. Anything is better than nothing if you have time constraints.   If you do not hear anything about your referral in the next 1-2 weeks, call our office and ask for an update.  Let us know if you need anything.  Healthy Eating Plan Many factors influence your heart health, including eating and exercise habits. Heart (coronary) risk increases with abnormal blood fat (lipid) levels. Heart-healthy meal planning includes limiting unhealthy fats, increasing healthy fats, and making other small dietary changes. This includes maintaining a healthy body weight to help keep lipid levels within a normal range.  WHAT IS MY PLAN?  Your health care provider recommends that you: Drink a glass of water before meals to help with satiety. Eat slowly. An alternative to the water is to add Metamucil. This will help with satiety as well. It does contain calories, unlike water.  WHAT TYPES OF FAT SHOULD I CHOOSE? Choose healthy fats more often. Choose monounsaturated and polyunsaturated fats, such as olive oil and canola oil, flaxseeds, walnuts, almonds, and seeds. Eat more omega-3 fats. Good choices include salmon, mackerel, sardines, tuna, flaxseed oil, and ground flaxseeds. Aim to eat fish at least two times each week. Avoid foods with partially hydrogenated oils in them. These contain trans fats. Examples of foods that contain trans fats are stick margarine, some tub margarines, cookies, crackers, and other baked goods. If you are going to avoid a fat, this is the one to avoid!  WHAT GENERAL GUIDELINES DO I NEED TO FOLLOW? Check food labels carefully to identify foods with trans fats. Avoid these types of  options when possible. Fill one half of your plate with vegetables and green salads. Eat 4-5 servings of vegetables per day. A serving of vegetables equals 1 cup of raw leafy vegetables,  cup of raw or cooked cut-up vegetables, or  cup of vegetable juice. Fill one fourth of your plate with whole grains. Look for the word "whole" as the first word in the ingredient list. Fill one fourth of your plate with lean protein foods. Eat 4-5 servings of fruit per day. A serving of fruit equals one medium whole fruit,  cup of dried fruit,  cup of fresh, frozen, or canned fruit. Try to avoid fruits in cups/syrups as the sugar content can be high. Eat more foods that contain soluble fiber. Examples of foods that contain this type of fiber are apples, broccoli, carrots, beans, peas, and barley. Aim to get 20-30 g of fiber per day. Eat more home-cooked food and less restaurant, buffet, and fast food. Limit or avoid alcohol. Limit foods that are high in starch and sugar. Avoid fried foods when able. Cook foods by using methods other than frying. Baking, boiling, grilling, and broiling are all great options. Other fat-reducing suggestions include: Removing the skin from poultry. Removing all visible fats from meats. Skimming the fat off of stews, soups, and gravies before serving them. Steaming vegetables in water or broth. Lose weight if you are overweight. Losing just 5-10% of your initial body weight can help your overall health and prevent diseases such as diabetes and heart disease. Increase your consumption of nuts, legumes, and  seeds to 4-5 servings per week. One serving of dried beans or legumes equals  cup after being cooked, one serving of nuts equals 1 ounces, and one serving of seeds equals  ounce or 1 tablespoon.  WHAT ARE GOOD FOODS CAN I EAT? Grains Grainy breads (try to find bread that is 3 g of fiber per slice or greater), oatmeal, light popcorn. Whole-grain cereals. Rice and pasta,  including brown rice and those that are made with whole wheat. Edamame pasta is a great alternative to grain pasta. It has a higher protein content. Try to avoid significant consumption of white bread, sugary cereals, or pastries/baked goods.  Vegetables All vegetables. Cooked white potatoes do not count as vegetables.  Fruits All fruits, but limit pineapple and bananas as these fruits have a higher sugar content.  Meats and Other Protein Sources Lean, well-trimmed beef, veal, pork, and lamb. Chicken and [place] without skin. All fish and shellfish. Wild duck, rabbit, pheasant, and venison. Egg whites or low-cholesterol egg substitutes. Dried beans, peas, lentils, and tofu. Seeds and most nuts.  Dairy Low-fat or nonfat cheeses, including ricotta, string, and mozzarella. Skim or 1% milk that is liquid, powdered, or evaporated. Buttermilk that is made with low-fat milk. Nonfat or low-fat yogurt. Soy/Almond milk are good alternatives if you cannot handle dairy.  Beverages Water is the best for you. Sports drinks with less sugar are more desirable unless you are a highly active athlete.  Sweets and Desserts Sherbets and fruit ices. Honey, jam, marmalade, jelly, and syrups. Dark chocolate.  Eat all sweets and desserts in moderation.  Fats and Oils Nonhydrogenated (trans-free) margarines. Vegetable oils, including soybean, sesame, sunflower, olive, peanut, safflower, corn, canola, and cottonseed. Salad dressings or mayonnaise that are made with a vegetable oil. Limit added fats and oils that you use for cooking, baking, salads, and as spreads.  Other Cocoa powder. Coffee and tea. Most condiments.  The items listed above may not be a complete list of recommended foods or beverages. Contact your dietitian for more options.

## 2022-01-07 ENCOUNTER — Other Ambulatory Visit (HOSPITAL_BASED_OUTPATIENT_CLINIC_OR_DEPARTMENT_OTHER): Payer: Self-pay

## 2022-01-07 NOTE — Telephone Encounter (Signed)
Pt asks this in regard to Zepbound?

## 2022-01-07 NOTE — Telephone Encounter (Signed)
PA initiated via Covermymeds;KEY: [id number]. Awaiting determination.

## 2022-01-08 NOTE — Telephone Encounter (Signed)
Approved From 01/07/2022---01/08/2023 Patient made aware.

## 2022-01-12 ENCOUNTER — Other Ambulatory Visit (HOSPITAL_COMMUNITY): Payer: Self-pay

## 2022-01-12 ENCOUNTER — Other Ambulatory Visit (HOSPITAL_BASED_OUTPATIENT_CLINIC_OR_DEPARTMENT_OTHER): Payer: Self-pay

## 2022-01-21 ENCOUNTER — Other Ambulatory Visit (HOSPITAL_BASED_OUTPATIENT_CLINIC_OR_DEPARTMENT_OTHER): Payer: Self-pay

## 2022-02-03 ENCOUNTER — Other Ambulatory Visit (HOSPITAL_BASED_OUTPATIENT_CLINIC_OR_DEPARTMENT_OTHER): Payer: Self-pay

## 2022-02-04 ENCOUNTER — Encounter: Payer: BC Managed Care – PPO | Admitting: Bariatrics

## 2022-02-05 ENCOUNTER — Other Ambulatory Visit (HOSPITAL_BASED_OUTPATIENT_CLINIC_OR_DEPARTMENT_OTHER): Payer: Self-pay

## 2022-02-08 ENCOUNTER — Other Ambulatory Visit (HOSPITAL_BASED_OUTPATIENT_CLINIC_OR_DEPARTMENT_OTHER): Payer: Self-pay

## 2022-02-17 ENCOUNTER — Ambulatory Visit (INDEPENDENT_AMBULATORY_CARE_PROVIDER_SITE_OTHER): Payer: BC Managed Care – PPO | Admitting: Family Medicine

## 2022-02-17 ENCOUNTER — Encounter: Payer: Self-pay | Admitting: Family Medicine

## 2022-02-17 NOTE — Patient Instructions (Addendum)
Strong work.  Keep the diet clean and stay active.  Let us know if you need anything.

## 2022-02-17 NOTE — Progress Notes (Signed)
Chief Complaint  Patient presents with   Follow-up    Subjective: Patient is a 27 y.o. female here for f/u.  Started on Zepbound 6 weeks ago. Currently taking 2.5 mg/week. Reports compliance, no AE's. Diet is fair though improving. She is exercising thru her new Oculus.   Past Medical History:  Diagnosis Date   Sleep apnea    on CPAP    Objective: BP 128/82 (BP Location: Left Arm, Patient Position: Sitting, Cuff Size: Large)   Pulse 91   Temp 98 F (36.7 C) (Oral)   Ht 5\' 5"  (1.651 m)   Wt (!) 389 lb 2 oz (176.5 kg)   SpO2 99%   BMI 64.75 kg/m  General: Awake, appears stated age Heart: RRR Lungs: CTAB, no rales, wheezes or rhonchi. No accessory muscle use Psych: Age appropriate judgment and insight, normal affect and mood  Assessment and Plan: Morbid obesity (HCC)  Chronic, somewhat improved. Will cont to escalate her dosage of Zepbound. Counseled on diet/exercise. F/u in 6 mo for CPE or prn.  The patient voiced understanding and agreement to the plan.  [place], DO 02/17/22  8:28 AM

## 2022-03-29 ENCOUNTER — Other Ambulatory Visit (HOSPITAL_BASED_OUTPATIENT_CLINIC_OR_DEPARTMENT_OTHER): Payer: Self-pay

## 2022-04-01 ENCOUNTER — Other Ambulatory Visit: Payer: Self-pay

## 2022-04-01 ENCOUNTER — Emergency Department (HOSPITAL_BASED_OUTPATIENT_CLINIC_OR_DEPARTMENT_OTHER): Payer: BC Managed Care – PPO

## 2022-04-01 ENCOUNTER — Encounter (HOSPITAL_BASED_OUTPATIENT_CLINIC_OR_DEPARTMENT_OTHER): Payer: Self-pay | Admitting: Pediatrics

## 2022-04-01 ENCOUNTER — Emergency Department (HOSPITAL_BASED_OUTPATIENT_CLINIC_OR_DEPARTMENT_OTHER)
Admission: EM | Admit: 2022-04-01 | Discharge: 2022-04-01 | Disposition: A | Discharge status: Home / Self Care | Payer: BC Managed Care – PPO | Attending: Emergency Medicine | Admitting: Emergency Medicine

## 2022-04-01 DIAGNOSIS — M5441 Lumbago with sciatica, right side: Secondary | ICD-10-CM

## 2022-04-01 DIAGNOSIS — M545 Low back pain, unspecified: Secondary | ICD-10-CM | POA: Diagnosis not present

## 2022-04-01 LAB — URINALYSIS, MICROSCOPIC (REFLEX): WBC, UA: NONE SEEN WBC/hpf (ref 0–5)

## 2022-04-01 LAB — PREGNANCY, URINE: Preg Test, Ur: NEGATIVE

## 2022-04-01 LAB — URINALYSIS, ROUTINE W REFLEX MICROSCOPIC
Bilirubin Urine: NEGATIVE
Glucose, UA: NEGATIVE mg/dL
Ketones, ur: NEGATIVE mg/dL
Leukocytes,Ua: NEGATIVE
Nitrite: NEGATIVE
Protein, ur: >=300 mg/dL — AB
Specific Gravity, Urine: >=1.03 (ref 1.005–1.030)
pH: 6 (ref 5.0–8.0)

## 2022-04-01 MED ORDER — KETOROLAC TROMETHAMINE 15 MG/ML IJ SOLN
15.0000 mg | Freq: Once | INTRAMUSCULAR | Status: AC
  Administered 2022-04-01: 15 mg via INTRAMUSCULAR
  Filled 2022-04-01: qty 1

## 2022-04-01 MED ORDER — LIDOCAINE 5 % EX PTCH
1.0000 | MEDICATED_PATCH | CUTANEOUS | Status: DC
  Administered 2022-04-01: 1 via TRANSDERMAL
  Filled 2022-04-01: qty 1

## 2022-04-01 MED ORDER — PREDNISONE 10 MG (21) PO TBPK: ORAL_TABLET | Freq: Every day | ORAL | 0 refills | Status: DC

## 2022-04-01 NOTE — ED Provider Notes (Signed)
EMERGENCY DEPARTMENT AT [facility] Provider Note   CSN: [id number] Arrival date & time: 04/01/22  1755     History  Chief Complaint  Patient presents with   Back Pain    Heather Barron is a 27 y.o. female.   Back Pain    Patient presents to the emergency department due to low back pain.  Started 2 to 3 days ago, to the middle of her back and goes down her right leg.  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  History of the same 15 months ago after her daughter was born but she has been fine since then.  Denies any loss of bladder or bowel function, bilateral weakness, fevers, chills, nausea, vomiting.  No previous surgeries to her back, no IV drug use, recent injuries, malignancy history.  Denies any dysuria, hematuria.  Home Medications Prior to Admission medications   Medication Sig Start Date End Date Taking? Authorizing Provider  predniSONE (STERAPRED UNI-PAK 21 TAB) 10 MG (21) TBPK tablet Take by mouth daily. Take 6 tabs by mouth daily  for 2 days, then 5 tabs for 2 days, then 4 tabs for 2 days, then 3 tabs for 2 days, 2 tabs for 2 days, then 1 tab by mouth daily for 2 days 04/01/22  Yes Sherrill Raring, PA-C  tirzepatide (ZEPBOUND) 5 MG/0.5ML Pen Inject 5 mg into the skin once a week for 28 days. 02/03/22 04/26/22  Shelda Pal, DO      Allergies    Patient has no known allergies.    Review of Systems   Review of Systems  Musculoskeletal:  Positive for back pain.    Physical Exam Updated Vital Signs BP 117/89 (BP Location: Right Arm)   Pulse 79   Temp 98.4 F (36.9 C) (Oral)   Resp 18   Ht '5\' 4"'$  (1.626 m)   Wt (!) 154.2 kg   SpO2 99%   BMI 58.36 kg/m  Physical Exam Vitals and nursing note reviewed. Exam conducted with a chaperone present.  Constitutional:      Appearance: Normal appearance.  HENT:     Head: Normocephalic and atraumatic.  Eyes:     General: No scleral  icterus.       Right eye: No discharge.        Left eye: No discharge.     Extraocular Movements: Extraocular movements intact.     Pupils: Pupils are equal, round, and reactive to light.  Cardiovascular:     Rate and Rhythm: Normal rate and regular rhythm.     Pulses: Normal pulses.     Heart sounds: Normal heart sounds.     No friction rub. No gallop.  Pulmonary:     Effort: Pulmonary effort is normal. No respiratory distress.     Breath sounds: Normal breath sounds.  Abdominal:     General: Abdomen is flat. Bowel sounds are normal. There is no distension.     Palpations: Abdomen is soft.     Tenderness: There is no abdominal tenderness.  Musculoskeletal:        General: Tenderness present.     Comments: Midline tenderness L-spine  Skin:    General: Skin is warm and dry.     Coloration: Skin is not jaundiced.  Neurological:     Mental Status: She is alert. Mental status is at baseline.     Coordination: Coordination normal.  Comments: Cranial nerves II 12 grossly intact right upper and lower extremity strength symmetric bilaterally.  Amatory steady gait.     ED Results / Procedures / Treatments   Labs (all labs ordered are listed, but only abnormal results are displayed) Labs Reviewed  URINALYSIS, ROUTINE W REFLEX MICROSCOPIC - Abnormal; Notable for the following components:      Result Value   Hgb urine dipstick TRACE (*)    Protein, ur >=300 (*)    All other components within normal limits  URINALYSIS, MICROSCOPIC (REFLEX) - Abnormal; Notable for the following components:   Bacteria, UA RARE (*)    All other components within normal limits  PREGNANCY, URINE    EKG None  Radiology DG Lumbar Spine Complete  Result Date: 04/01/2022 CLINICAL DATA:  Lower back pain for 2-3 days without known injury. EXAM: LUMBAR SPINE - COMPLETE 4+ VIEW COMPARISON:  None Available. FINDINGS: No acute fracture or evidence of traumatic malalignment. Disc space height is maintained.  IMPRESSION: Negative. Electronically Signed   By: Placido Sou M.D.   On: 04/01/2022 20:30    Procedures Procedures    Medications Ordered in ED Medications  lidocaine (LIDODERM) 5 % 1 patch (1 patch Transdermal Patch Applied 04/01/22 2003)  ketorolac (TORADOL) 15 MG/ML injection 15 mg (15 mg Intramuscular Given 04/01/22 2000)    ED Course/ Medical Decision Making/ A&P                             Medical Decision Making Amount and/or Complexity of Data Reviewed Labs: ordered. Radiology: ordered.  Risk Prescription drug management.   Patient presents for low back pain.  She is not really having any urinary symptoms,   UA was ordered and negative for UTI.  There is no flank pain on exam, I do not think this is nephrolithiasis.  Afebrile, no nausea or vomiting, no UTI, not consistent with pyelonephritis.    There was some slight midline tenderness, plain film lumbar spine was ordered and negative.  I do not really think there was compression or occult fracture given it was atraumatic, she is afebrile, no risk factors for epidural abscess and I think it is less likely especially given normal neuroexam.    No traumatic history, no red flag symptoms for cauda equina or malignancy, physical exam also does not support any cord compression.  Suspect sciatica, will cover with steroid Dosepak and orthopedic referral.          Final Clinical Impression(s) / ED Diagnoses Final diagnoses:  Acute midline low back pain with right-sided sciatica    Rx / DC Orders ED Discharge Orders          Ordered    predniSONE (STERAPRED UNI-PAK 21 TAB) 10 MG (21) TBPK tablet  Daily        04/01/22 2045              Sherrill Raring, PA-C 04/01/22 2308    Drenda Freeze, MD 04/02/22 [phone]

## 2022-04-01 NOTE — ED Notes (Signed)
Patient transported to X-ray 

## 2022-04-01 NOTE — ED Triage Notes (Signed)
C/O lower back pain radiating up the middle x 3 days; denies any recent injury.

## 2022-04-01 NOTE — Discharge Instructions (Signed)
You are seen today for back pain.  The x-rays and urine were reassuring.  Take the steroid Dosepak as prescribed, you can also take Tylenol on top of it for breakthrough pain.  Follow-up with worst medicine if symptoms persist, return to the ED for loss of bladder or bowel function, fevers, new or concerning symptoms.

## 2022-04-02 ENCOUNTER — Ambulatory Visit: Payer: BC Managed Care – PPO | Admitting: Family Medicine

## 2022-04-05 ENCOUNTER — Ambulatory Visit: Payer: BC Managed Care – PPO | Admitting: Family Medicine

## 2022-04-17 ENCOUNTER — Other Ambulatory Visit: Payer: Self-pay

## 2022-04-17 ENCOUNTER — Emergency Department: Payer: BC Managed Care – PPO

## 2022-04-17 ENCOUNTER — Emergency Department
Admission: EM | Admit: 2022-04-17 | Discharge: 2022-04-17 | Disposition: A | Discharge status: Home / Self Care | Payer: BC Managed Care – PPO | Attending: Emergency Medicine | Admitting: Emergency Medicine

## 2022-04-17 ENCOUNTER — Encounter: Payer: Self-pay | Admitting: Intensive Care

## 2022-04-17 DIAGNOSIS — J45909 Unspecified asthma, uncomplicated: Secondary | ICD-10-CM | POA: Diagnosis not present

## 2022-04-17 DIAGNOSIS — M7661 Achilles tendinitis, right leg: Secondary | ICD-10-CM | POA: Diagnosis not present

## 2022-04-17 DIAGNOSIS — M79671 Pain in right foot: Secondary | ICD-10-CM | POA: Diagnosis not present

## 2022-04-17 MED ORDER — MELOXICAM 15 MG PO TABS: 15.0000 mg | ORAL_TABLET | Freq: Every day | ORAL | 0 refills | Status: DC

## 2022-04-17 MED ORDER — KETOROLAC TROMETHAMINE 30 MG/ML IJ SOLN
30.0000 mg | Freq: Once | INTRAMUSCULAR | Status: AC
  Administered 2022-04-17: 30 mg via INTRAMUSCULAR
  Filled 2022-04-17: qty 1

## 2022-04-17 NOTE — ED Provider Notes (Signed)
[facility] Provider Note    Event Date/Time   First MD Initiated Contact with Patient 04/17/22 1856     (approximate)   History   Chief Complaint Tendonitis   HPI  Heather Barron is a 27 y.o. female with past medical history of asthma and hidradenitis who presents to the ED complaining of foot pain.  Patient reports that she has been dealing with worsening pain in the posterior portion of her foot, near her heel, since getting up this morning.  She states it is painful for her to bear weight on her right foot, but she denies any specific injury to the foot or ankle.  She reports some swelling in the area, has not noticed any redness or drainage.  She denies any history of similar symptoms, reports taking a dose of Norco with partial relief in pain.     Physical Exam   Triage Vital Signs: ED Triage Vitals  Enc Vitals Group     BP 04/17/22 1855 122/76     Pulse Rate 04/17/22 1855 82     Resp 04/17/22 1855 18     Temp 04/17/22 1851 98.7 F (37.1 C)     Temp Source 04/17/22 1851 Oral     SpO2 04/17/22 1855 99 %     Weight 04/17/22 1851 280 lb (127 kg)     Height 04/17/22 1851 5\' 5"  (1.651 m)     Head Circumference --      Peak Flow --      Pain Score 04/17/22 1851 8     Pain Loc --      Pain Edu? --      Excl. in [place]? --     Most recent vital signs: Vitals:   04/17/22 1851 04/17/22 1855  BP:  122/76  Pulse:  82  Resp:  18  Temp: 98.7 F (37.1 C)   SpO2:  99%    Constitutional: Alert and oriented. Eyes: Conjunctivae are normal. Head: Atraumatic. Nose: No congestion/rhinnorhea. Mouth/Throat: Mucous membranes are moist.  Cardiovascular: Normal rate, regular rhythm. Grossly normal heart sounds.  2+ radial and DP pulses bilaterally. Respiratory: Normal respiratory effort.  No retractions. Lungs CTAB. Gastrointestinal: Soft and nontender. No distention. Musculoskeletal: Tenderness palpation noted near the insertion of right Achilles  tendon into the calcaneus, no overlying erythema, warmth, or edema noted.  No tenderness to palpation at medial or lateral malleolus.  Thompson test is negative. Neurologic:  Normal speech and language. No gross focal neurologic deficits are appreciated.    ED Results / Procedures / Treatments   Labs (all labs ordered are listed, but only abnormal results are displayed) Labs Reviewed - No data to display  RADIOLOGY Foot x-ray reviewed and interpreted by me with no fracture or dislocation.  PROCEDURES:  Critical Care performed: No  Procedures   MEDICATIONS ORDERED IN ED: Medications  ketorolac (TORADOL) 30 MG/ML injection 30 mg (30 mg Intramuscular Given 04/17/22 1927)     IMPRESSION / MDM / ASSESSMENT AND PLAN / ED COURSE  I reviewed the triage vital signs and the nursing notes.                              27 y.o. female with past medical history of asthma and hidradenitis who presents to the ED with gradually worsening pain near her right heel since getting up this morning.  Patient's presentation is most consistent with acute complicated  illness / injury requiring diagnostic workup.  Differential diagnosis includes, but is not limited to, fracture, dislocation, tendinitis, Achilles tendon rupture.  Patient well-appearing and in no acute distress, vital signs are unremarkable.  She is neurovascular intact distally with strong pulse and motor function.  [place] Silos test is reassuring with no signs of tendon rupture.  We will check x-ray for evidence of bony injury, treat symptomatically with IM Toradol.  Patient states there is no chance that she could be pregnant.  Foot x-ray is unremarkable, pain improving following dose of Toradol.  Patient is appropriate for discharge home with outpatient follow-up, was counseled to continue anti-inflammatories and apply ice as needed.  She was counseled to return to the ED for new or worsening symptoms, patient agrees with plan.       FINAL CLINICAL IMPRESSION(S) / ED DIAGNOSES   Final diagnoses:  Achilles tendinitis of right lower extremity     Rx / DC Orders   ED Discharge Orders     None        Note:  This document was prepared using Dragon voice recognition software and may include unintentional dictation errors.   Blake Divine, MD 04/17/22 [phone]

## 2022-04-17 NOTE — ED Triage Notes (Signed)
Patient c/o achilles pain on right foot that started last night and has progressively gotten worse. Reports it it hard to walk today due to pain

## 2022-05-03 ENCOUNTER — Encounter: Payer: Self-pay | Admitting: *Deleted

## 2022-06-01 ENCOUNTER — Other Ambulatory Visit: Payer: Self-pay

## 2022-08-20 ENCOUNTER — Ambulatory Visit: Payer: BC Managed Care – PPO | Admitting: Family Medicine

## 2022-08-20 ENCOUNTER — Encounter: Payer: Self-pay | Admitting: Family Medicine

## 2022-08-20 ENCOUNTER — Other Ambulatory Visit (HOSPITAL_BASED_OUTPATIENT_CLINIC_OR_DEPARTMENT_OTHER): Payer: Self-pay

## 2022-11-30 ENCOUNTER — Telehealth (INDEPENDENT_AMBULATORY_CARE_PROVIDER_SITE_OTHER): Payer: BC Managed Care – PPO | Admitting: Family Medicine

## 2022-11-30 ENCOUNTER — Encounter: Payer: Self-pay | Admitting: Family Medicine

## 2022-11-30 DIAGNOSIS — G4733 Obstructive sleep apnea (adult) (pediatric): Secondary | ICD-10-CM | POA: Diagnosis not present

## 2022-11-30 NOTE — Progress Notes (Signed)
CC: F/u  Subjective: Patient is a 27 y.o. female here for discussion of weight loss. We are interacting via web portal for an electronic face-to-face visit. I verified patient's ID using 2 identifiers. Patient agreed to proceed with visit via this method. Patient is at work, I am at office. Patient and I are present for visit.   In process of pursuing bariatric surgery. Was on Mounjaro, lost over 100 lbs. Gained much of it back after ins stopped covering it. Diet is OK. Cycling and walking for exercise.   OSA on CPAP Seeing sleep specialist, needs a new referral for updated tubing and supplies. [employer] her last sleep specialist was with [facility].  She has not been seeing them routinely.  Her last sleep study was around 2020.  Past Medical History:  Diagnosis Date   Sleep apnea    on CPAP    Objective: No conversational dyspnea Age appropriate judgment and insight Nml affect and mood  Assessment and Plan: Morbid obesity (HCC)  OSA on CPAP - Plan: Ambulatory referral to Pulmonology  Chronic, unstable.  Insurance no longer covers [employer].  She is working with the [facility] bariatric surgery team.  Counseled on diet and exercise.  Follow-up as originally scheduled.  She needs a certain number of PCP visits for admitted to cover surgery. Refer to the [facility] pulmonology team who she saw in the past for her OSA management. The patient voiced understanding and agreement to the plan.  Jilda Roche [place], DO 11/30/22  12:15 PM

## 2022-12-07 ENCOUNTER — Other Ambulatory Visit: Payer: Self-pay

## 2022-12-07 ENCOUNTER — Encounter: Payer: Self-pay | Admitting: Family Medicine

## 2022-12-07 DIAGNOSIS — G4733 Obstructive sleep apnea (adult) (pediatric): Secondary | ICD-10-CM

## 2022-12-10 ENCOUNTER — Ambulatory Visit: Payer: 59 | Admitting: Podiatry

## 2022-12-15 ENCOUNTER — Other Ambulatory Visit: Payer: Self-pay

## 2022-12-15 ENCOUNTER — Emergency Department
Admission: EM | Admit: 2022-12-15 | Discharge: 2022-12-15 | Disposition: A | Discharge status: Home / Self Care | Payer: BC Managed Care – PPO | Attending: Emergency Medicine | Admitting: Emergency Medicine

## 2022-12-15 DIAGNOSIS — J029 Acute pharyngitis, unspecified: Secondary | ICD-10-CM | POA: Diagnosis not present

## 2022-12-15 DIAGNOSIS — R0981 Nasal congestion: Secondary | ICD-10-CM | POA: Insufficient documentation

## 2022-12-15 DIAGNOSIS — M791 Myalgia, unspecified site: Secondary | ICD-10-CM | POA: Diagnosis not present

## 2022-12-15 DIAGNOSIS — Z20822 Contact with and (suspected) exposure to covid-19: Secondary | ICD-10-CM | POA: Diagnosis not present

## 2022-12-15 DIAGNOSIS — R112 Nausea with vomiting, unspecified: Secondary | ICD-10-CM | POA: Diagnosis not present

## 2022-12-15 DIAGNOSIS — A084 Viral intestinal infection, unspecified: Secondary | ICD-10-CM | POA: Insufficient documentation

## 2022-12-15 LAB — URINALYSIS, ROUTINE W REFLEX MICROSCOPIC
Bilirubin Urine: NEGATIVE
Glucose, UA: NEGATIVE mg/dL
Hgb urine dipstick: NEGATIVE
Ketones, ur: NEGATIVE mg/dL
Leukocytes,Ua: NEGATIVE
Nitrite: NEGATIVE
Protein, ur: >=300 mg/dL — AB
Specific Gravity, Urine: 1.027 (ref 1.005–1.030)
pH: 6 (ref 5.0–8.0)

## 2022-12-15 LAB — COMPREHENSIVE METABOLIC PANEL
ALT: 12 U/L (ref 0–44)
AST: 15 U/L (ref 15–41)
Albumin: 3.6 g/dL (ref 3.5–5.0)
Alkaline Phosphatase: 53 U/L (ref 38–126)
Anion gap: 7 (ref 5–15)
BUN: 8 mg/dL (ref 6–20)
CO2: 25 mmol/L (ref 22–32)
Calcium: 8.9 mg/dL (ref 8.9–10.3)
Chloride: 100 mmol/L (ref 98–111)
Creatinine, Ser: 0.77 mg/dL (ref 0.44–1.00)
GFR, Estimated: >60 mL/min (ref >60)
Glucose, Bld: 130 mg/dL — ABNORMAL HIGH (ref 70–99)
Potassium: 3.3 mmol/L — ABNORMAL LOW (ref 3.5–5.1)
Sodium: 132 mmol/L — ABNORMAL LOW (ref 135–145)
Total Bilirubin: 0.6 mg/dL (ref <1.2)
Total Protein: 8.4 g/dL — ABNORMAL HIGH (ref 6.5–8.1)

## 2022-12-15 LAB — CBC
HCT: 36.8 % (ref 36.0–46.0)
Hemoglobin: 11.3 g/dL — ABNORMAL LOW (ref 12.0–15.0)
MCH: 25 pg — ABNORMAL LOW (ref 26.0–34.0)
MCHC: 30.7 g/dL (ref 30.0–36.0)
MCV: 81.4 fL (ref 80.0–100.0)
Platelets: 268 10*3/uL (ref 150–400)
RBC: 4.52 MIL/uL (ref 3.87–5.11)
RDW: 14.6 % (ref 11.5–15.5)
WBC: 11.4 10*3/uL — ABNORMAL HIGH (ref 4.0–10.5)
nRBC: 0 % (ref 0.0–0.2)

## 2022-12-15 LAB — GROUP A STREP BY PCR: Group A Strep by PCR: NOT DETECTED

## 2022-12-15 LAB — RESP PANEL BY RT-PCR (RSV, FLU A&B, COVID)  RVPGX2
Influenza A by PCR: NEGATIVE
Influenza B by PCR: NEGATIVE
Resp Syncytial Virus by PCR: NEGATIVE
SARS Coronavirus 2 by RT PCR: NEGATIVE

## 2022-12-15 LAB — LIPASE, BLOOD: Lipase: 37 U/L (ref 11–51)

## 2022-12-15 LAB — POC URINE PREG, ED: Preg Test, Ur: NEGATIVE

## 2022-12-15 MED ORDER — ONDANSETRON 4 MG PO TBDP
4.0000 mg | ORAL_TABLET | Freq: Once | ORAL | Status: AC
  Administered 2022-12-15: 4 mg via ORAL
  Filled 2022-12-15: qty 1

## 2022-12-15 MED ORDER — ACETAMINOPHEN 500 MG PO TABS
1000.0000 mg | ORAL_TABLET | Freq: Once | ORAL | Status: AC
  Administered 2022-12-15: 1000 mg via ORAL
  Filled 2022-12-15: qty 2

## 2022-12-15 MED ORDER — ONDANSETRON HCL 4 MG PO TABS: 4.0000 mg | ORAL_TABLET | Freq: Three times a day (TID) | ORAL | 0 refills | Status: DC | PRN

## 2022-12-15 MED ORDER — IBUPROFEN 600 MG PO TABS
600.0000 mg | ORAL_TABLET | Freq: Once | ORAL | Status: AC
  Administered 2022-12-15: 600 mg via ORAL
  Filled 2022-12-15: qty 1

## 2022-12-15 NOTE — Discharge Instructions (Addendum)
Please pick up your medication from the pharmacy and take for nausea as needed.  Hydration is the biggest key in the first few days.  You can take Pepto-Bismol as well as needed to help with diarrheal symptoms.  Follow-up with your PCP as needed and return for any severe worsening symptoms.

## 2022-12-15 NOTE — ED Triage Notes (Signed)
Pt arrives via POV. Pt reports nausea, sore throat, vomiting, diarrhea, runny nose,headache, and congestion for about 2 days. Reports daughter had similar symptoms recently.

## 2022-12-15 NOTE — ED Notes (Signed)
See triage notes. Patient c/o N/V/D, sore throat, runny nose, headache and congestion times two days.

## 2022-12-15 NOTE — ED Provider Notes (Signed)
[facility] Provider Note    Event Date/Time   First MD Initiated Contact with Patient 12/15/22 Paulo Fruit     (approximate)   History   Nausea, Emesis, Diarrhea, and Sore Throat   HPI Heather Barron is a 27 y.o. female with history of depression presenting today for viral symptoms.  Patient states over the past 2 days she has had bodyaches, nasal congestion, nausea, vomiting, diarrhea, sore throat, and runny nose.  Daughter had similar symptoms at home.  Otherwise denies chest pain, shortness of breath, specific abdominal pain, dysuria.  Still tolerating p.o.  Prior surgical history of cholecystectomy.  Reviewed most recent chart notes.     Physical Exam   Triage Vital Signs: ED Triage Vitals  Encounter Vitals Group     BP 12/15/22 1734 (!) 165/87     Systolic BP Percentile --      Diastolic BP Percentile --      Pulse Rate 12/15/22 1734 98     Resp 12/15/22 1734 19     Temp 12/15/22 1734 99.1 F (37.3 C)     Temp src --      SpO2 12/15/22 1734 97 %     Weight 12/15/22 1735 (!) 380 lb (172.4 kg)     Height 12/15/22 1735 5\' 5"  (1.651 m)     Head Circumference --      Peak Flow --      Pain Score --      Pain Loc --      Pain Education --      Exclude from Growth Chart --     Most recent vital signs: Vitals:   12/15/22 1734  BP: (!) 165/87  Pulse: 98  Resp: 19  Temp: 99.1 F (37.3 C)  SpO2: 97%    Physical Exam: I have reviewed the vital signs and nursing notes. General: Awake, alert, no acute distress.  Nontoxic appearing. Head:  Atraumatic, normocephalic.   ENT:  EOM intact, PERRL. Oral mucosa is pink and moist with no lesions.  Prominent nasal congestion present Neck: Neck is supple with full range of motion,  Cardiovascular:  RRR, No murmurs. Peripheral pulses palpable and equal bilaterally. Respiratory:  Symmetrical chest wall expansion.  No rhonchi, rales, or wheezes.  Good air movement throughout.  No use of accessory  muscles.   Musculoskeletal:  No cyanosis or edema. Moving extremities with full ROM Abdomen:  Soft, nontender, nondistended. Neuro:  GCS 15, moving all four extremities, interacting appropriately. Speech clear. Psych:  Calm, appropriate.   Skin:  Warm, dry, no rash.    ED Results / Procedures / Treatments   Labs (all labs ordered are listed, but only abnormal results are displayed) Labs Reviewed  COMPREHENSIVE METABOLIC PANEL - Abnormal; Notable for the following components:      Result Value   Sodium 132 (*)    Potassium 3.3 (*)    Glucose, Bld 130 (*)    Total Protein 8.4 (*)    All other components within normal limits  CBC - Abnormal; Notable for the following components:   WBC 11.4 (*)    Hemoglobin 11.3 (*)    MCH 25.0 (*)    All other components within normal limits  URINALYSIS, ROUTINE W REFLEX MICROSCOPIC - Abnormal; Notable for the following components:   Color, Urine YELLOW (*)    APPearance HAZY (*)    Protein, ur >=300 (*)    Bacteria, UA RARE (*)    All other  components within normal limits  RESP PANEL BY RT-PCR (RSV, FLU A&B, COVID)  RVPGX2  GROUP A STREP BY PCR  LIPASE, BLOOD  POC URINE PREG, ED     EKG    RADIOLOGY    PROCEDURES:  Critical Care performed: No  Procedures   MEDICATIONS ORDERED IN ED: Medications  ibuprofen (ADVIL) tablet 600 mg (has no administration in time range)  acetaminophen (TYLENOL) tablet 1,000 mg (has no administration in time range)  ondansetron (ZOFRAN-ODT) disintegrating tablet 4 mg (4 mg Oral Given 12/15/22 1851)     IMPRESSION / MDM / ASSESSMENT AND PLAN / ED COURSE  I reviewed the triage vital signs and the nursing notes.                              Differential diagnosis includes, but is not limited to, viral URI, viral gastroenteritis, lower suspicion for intra-abdominal infection  Patient's presentation is most consistent with acute complicated illness / injury requiring diagnostic  workup.  Patient is a 27 year old female presenting today for 2 days of cough, congestion, nausea, vomiting, diarrhea, and bodyaches.  Family members at home with similar symptoms.  Most consistent with viral gastroenteritis.  Laboratory workup with unremarkable UA, normal LFTs and T. bili.  Normal lipase.  Mild leukocytosis likely secondary to viral GI.  She is otherwise very well-appearing on exam.  Negative viral swabs and negative strep test.  Patient was given Zofran able to tolerate p.o. without issue.  Will discharge with Zofran and symptomatic management for viral GI infection and was given strict return precautions.     FINAL CLINICAL IMPRESSION(S) / ED DIAGNOSES   Final diagnoses:  Viral gastroenteritis     Rx / DC Orders   ED Discharge Orders          Ordered    ondansetron (ZOFRAN) 4 MG tablet  Every 8 hours PRN        12/15/22 1851             Note:  This document was prepared using Dragon voice recognition software and may include unintentional dictation errors.   Janith Lima, MD 12/15/22 [phone]

## 2022-12-21 ENCOUNTER — Telehealth: Payer: Self-pay

## 2022-12-21 NOTE — Transitions of Care (Post Inpatient/ED Visit) (Signed)
   12/21/2022  Name: Heather Barron MRN: [id number] DOB: Mar 09, 1995  Today's TOC FU Call Status: Today's TOC FU Call Status:: Unsuccessful Call (1st Attempt) Unsuccessful Call (1st Attempt) Date: 12/21/22  Attempted to reach the patient regarding the most recent Inpatient/ED visit.  Follow Up Plan: Additional outreach attempts will be made to reach the patient to complete the Transitions of Care (Post Inpatient/ED visit) call.   Signature Karena Addison, LPN [facility] Nurse Health Advisor Direct Dial [phone]

## 2022-12-22 ENCOUNTER — Ambulatory Visit: Payer: 59 | Admitting: Podiatry

## 2022-12-23 NOTE — Transitions of Care (Post Inpatient/ED Visit) (Signed)
   12/23/2022  Name: SACHEEN KRUMWIEDE MRN: [id number] DOB: 1995/11/29  Today's TOC FU Call Status: Today's TOC FU Call Status:: Unsuccessful Call (2nd Attempt) Unsuccessful Call (1st Attempt) Date: 12/21/22 Unsuccessful Call (2nd Attempt) Date: 12/23/22  Attempted to reach the patient regarding the most recent Inpatient/ED visit.  Follow Up Plan: Additional outreach attempts will be made to reach the patient to complete the Transitions of Care (Post Inpatient/ED visit) call.   Signature Karena Addison, LPN [facility] Nurse Health Advisor Direct Dial [phone]

## 2022-12-27 ENCOUNTER — Telehealth: Payer: Self-pay

## 2022-12-27 NOTE — Transitions of Care (Post Inpatient/ED Visit) (Signed)
   12/27/2022  Name: Heather Barron MRN: [id number] DOB: Jun 28, 1995  Today's TOC FU Call Status: Today's TOC FU Call Status:: Unsuccessful Call (1st Attempt) Unsuccessful Call (1st Attempt) Date: 12/27/22  Attempted to reach the patient regarding the most recent Inpatient/ED visit.  Follow Up Plan: Additional outreach attempts will be made to reach the patient to complete the Transitions of Care (Post Inpatient/ED visit) call.   Conchita Truxillo Daphine Deutscher BSN, RN RN Care Manager   Transitions of Care [facility] - [facility] Direct Dial Number:  [phone]

## 2022-12-28 ENCOUNTER — Telehealth: Payer: Self-pay | Admitting: *Deleted

## 2022-12-28 NOTE — Transitions of Care (Post Inpatient/ED Visit) (Signed)
   12/28/2022  Name: Heather Barron MRN: [id number] DOB: 05/18/1995  Today's TOC FU Call Status: Today's TOC FU Call Status:: Unsuccessful Call (2nd Attempt) Unsuccessful Call (2nd Attempt) Date: 12/28/22  Attempted to reach the patient regarding the most recent Inpatient/ED visit.  Follow Up Plan: Additional outreach attempts will be made to reach the patient to complete the Transitions of Care (Post Inpatient/ED visit) call.   Gean Maidens BSN RN Population Health- Transition of Care Team.  [facility] [phone]

## 2022-12-28 NOTE — Transitions of Care (Post Inpatient/ED Visit) (Signed)
   12/28/2022  Name: Heather Barron MRN: [id number] DOB: 11-Jul-1995  Today's TOC FU Call Status: Today's TOC FU Call Status:: Unsuccessful Call (3rd Attempt) Unsuccessful Call (3rd Attempt) Date: 12/28/22  Attempted to reach the patient regarding the most recent Inpatient/ED visit.  Follow Up Plan: No further outreach attempts will be made at this time. We have been unable to contact the patient.  Gean Maidens BSN RN Population Health- Transition of Care Team.  [facility] [phone]

## 2022-12-28 NOTE — Transitions of Care (Post Inpatient/ED Visit) (Signed)
   12/28/2022  Name: Heather Barron MRN: [id number] DOB: Oct 15, 1995  Today's TOC FU Call Status: Today's TOC FU Call Status:: Unsuccessful Call (3rd Attempt) Unsuccessful Call (1st Attempt) Date: 12/21/22 Unsuccessful Call (2nd Attempt) Date: 12/23/22 Unsuccessful Call (3rd Attempt) Date: 12/28/22  Attempted to reach the patient regarding the most recent Inpatient/ED visit.  Follow Up Plan: No further outreach attempts will be made at this time. We have been unable to contact the patient.  Signature Karena Addison, LPN [facility] Nurse Health Advisor Direct Dial [phone]

## 2023-01-31 ENCOUNTER — Encounter: Payer: Self-pay | Admitting: Family Medicine

## 2023-01-31 ENCOUNTER — Ambulatory Visit (INDEPENDENT_AMBULATORY_CARE_PROVIDER_SITE_OTHER): Payer: 59 | Admitting: Family Medicine

## 2023-01-31 DIAGNOSIS — R058 Other specified cough: Secondary | ICD-10-CM | POA: Diagnosis not present

## 2023-01-31 DIAGNOSIS — Z6841 Body Mass Index (BMI) 40.0 and over, adult: Secondary | ICD-10-CM

## 2023-01-31 MED ORDER — BENZONATATE 200 MG PO CAPS: 200.0000 mg | ORAL_CAPSULE | Freq: Two times a day (BID) | ORAL | 0 refills | Status: DC | PRN

## 2023-01-31 MED ORDER — PHENTERMINE HCL 37.5 MG PO CAPS: 37.5000 mg | ORAL_CAPSULE | ORAL | 0 refills | Status: DC

## 2023-01-31 NOTE — Patient Instructions (Signed)
 Consider GoodRx if the medicine is too expensive.   Keep the diet clean and stay active.  Aim to do some physical exertion for 150 minutes per week. This is typically divided into 5 days per week, 30 minutes per day. The activity should be enough to get your heart rate up. Anything is better than nothing if you have time constraints.  Let us  know if you need anything.

## 2023-01-31 NOTE — Progress Notes (Signed)
 Chief Complaint  Patient presents with   Weight Loss    Discuss weight loss    Subjective: Patient is a 28 y.o. female here for weight loss options.  Patient has been struggling with her weight.  She is working with the [facility] bariatric surgery team.  That surgery has been postponed due to the need to have her tonsils removed.  She is requesting a short-term medication.  She checked with her insurance company and found that injectables are not covered this year either.  Diet is okay.  She does have some issues with cravings.  She does not exercise routinely.  Over the past 3 weeks she has been dealing with a cough.  It started as a cold she got from her 61-year-old daughter.  She no longer has any symptoms other than a dry cough that comes intermittently.  Past Medical History:  Diagnosis Date   Sleep apnea    on CPAP    Objective: BP 130/84   Pulse 84   Temp 98 F (36.7 C) (Oral)   Resp 16   Ht 5' 5 (1.651 m)   Wt (!) 409 lb 9.6 oz (185.8 kg)   SpO2 96%   BMI 68.16 kg/m  General: Awake, appears stated age Heart: RRR, no LE edema Lungs: CTAB, no rales, wheezes or rhonchi. 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 Counseled on diet/exercise. Start phentermine  37.5 mg/d. F/u in 1 mo. Tessalon  Perles prn. Consider ICS and imaging if no improvement.  The patient voiced understanding and agreement to the plan.  Mabel [place], DO 01/31/23  4:47 PM

## 2023-03-04 ENCOUNTER — Ambulatory Visit: Payer: 59 | Admitting: Family Medicine

## 2023-06-09 ENCOUNTER — Other Ambulatory Visit: Payer: Self-pay

## 2023-06-09 ENCOUNTER — Ambulatory Visit
Admission: RE | Admit: 2023-06-09 | Discharge: 2023-06-09 | Disposition: A | Discharge status: Home / Self Care | Payer: Self-pay | Source: Ambulatory Visit | Attending: Physician Assistant | Admitting: Physician Assistant

## 2023-06-09 ENCOUNTER — Ambulatory Visit: Payer: Self-pay

## 2023-06-09 VITALS — BP 110/73 | HR 85 | Temp 98.9°F | Resp 18 | Ht 65.0 in | Wt 347.0 lb

## 2023-06-09 DIAGNOSIS — M436 Torticollis: Secondary | ICD-10-CM

## 2023-06-09 MED ORDER — KETOROLAC TROMETHAMINE 30 MG/ML IJ SOLN: 30.0000 mg | Freq: Once | INTRAMUSCULAR | Status: DC

## 2023-06-09 MED ORDER — ACETAMINOPHEN 325 MG PO TABS
975.0000 mg | ORAL_TABLET | Freq: Once | ORAL | Status: AC
  Administered 2023-06-09: 975 mg via ORAL

## 2023-06-09 NOTE — Telephone Encounter (Signed)
  Chief Complaint: neck pain Symptoms: painful swallowing, left sided neck pain and swelling Frequency: x 4 days Pertinent Negatives: Patient denies weakness or numbness in arms or legs, chest pain, difficulty breathing, fever.  Disposition: [] ED /[x] Urgent Care (no appt availability in office) / [] Appointment(In office/virtual)/ []  [facility]/ [] Home Care/ [] Refused Recommended Disposition /[] [facility]/ []  Follow-up with PCP Additional Notes: Patient states she tried Tylenol  and heating pad. She states she thinks slept on her neck wrong and that it feels like a crick in her neck. 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 Offered soonest appt with PCP tomorrow and patient states she will go to urgent care instead.  Copied from CRM [id number]. Topic: Clinical - Red Word Triage >> Jun 09, 2023  2:24 PM Luane Rumps D wrote: Red Word that prompted transfer to Nurse Triage: Crick in neck, hurts to swallow. 8/10 pain, been going on for 4 days. Reason for Disposition  [1] SEVERE neck pain (e.g., excruciating, unable to do any normal activities) AND [2] not improved after 2 hours of pain medicine  Answer Assessment - Initial Assessment Questions 1. ONSET: "When did the pain begin?"      X 4 days.  2. LOCATION: "Where does it hurt?"      Left, sometimes on the right as well.  3. PATTERN "Does the pain come and go, or has it been constant since it started?"      Constant.  4. SEVERITY: "How bad is the pain?"  (Scale 1-10; or mild, moderate, severe)   - NO PAIN (0): no pain or only slight stiffness    - MILD (1-3): doesn't interfere with normal activities    - MODERATE (4-7): interferes with normal activities or awakens from sleep    - SEVERE (8-10):  excruciating pain, unable to do any normal activities      8/10.  5. RADIATION: "Does the pain go anywhere else, shoot into your arms?"      She states when she lifts her arms, the pain shoots to her arms.  6. CORD SYMPTOMS: "Any weakness or numbness of the arms or legs?"     No.  7. CAUSE: "What do you think is causing the neck pain?"     She states it feels like a crick in her neck, she states like she slept on it wrong.  8. NECK OVERUSE: "Any recent activities that involved turning or twisting the neck?"     She states she has been walking more but doesn't think that has affected it.  9. OTHER SYMPTOMS: "Do you have any other symptoms?" (e.g., headache, fever, chest pain, difficulty breathing, neck swelling)     Headache (when she doesn't eat), left side of neck feels swollen.  10. PREGNANCY: "Is there any chance you are pregnant?" "When was your last menstrual period?"       05/28/23.  Protocols used: Neck Pain or Stiffness-A-AH

## 2023-06-09 NOTE — ED Triage Notes (Addendum)
 Pt presents with complaints of neck pain x 4 days. Pt states her pain is worse on the left side and it is beginning to radiate down into her left shoulder, area feels swollen. Pt currently rates her overall pain a 10/10. OTC Tylenol  taken + heating pad applied with no relief/improvement in symptoms. Pt voices the pain is making it difficult for her to swallow.

## 2023-06-09 NOTE — ED Provider Notes (Signed)
 [facility]    CSN: [id number] Arrival date & time: 06/09/23  1758      History   Chief Complaint Chief Complaint  Patient presents with   Neck Pain    HPI Heather Barron is a 28 y.o. female.   HPI Patient presents today for concerns for left sided neck pain  She reports this has been ongoing for about 4 days and is not getting better She reports difficulty with turning her head and swallowing She reports pain with swallowing but mechanically she is able to complete the motion  She denies recent injuries or trauma but thinks she may have slept on it wrong  She reports swelling on the left side of her neck as well   Interventions: Tylenol  and heating pad  She recently had bariatric surgery on April 8th so she is not able to take some medications  Aggravating: turning her head Alleviating: nothing    Past Medical History:  Diagnosis Date   Sleep apnea    on CPAP    Patient Active Problem List   Diagnosis Date Noted   Abdominal pain 11/14/2020   Cholelithiasis 11/14/2020   OSA on CPAP 11/14/2020   Hypochromic anemia 11/14/2020   Transaminitis 11/14/2020   Biliary dyskinesia 11/14/2020   Severe preeclampsia, third trimester 10/15/2020   Morbid obesity (HCC) 06/27/2019   Depression 06/27/2019   Hidradenitis suppurativa 06/27/2019   History of abnormal pap LSIL 08/24/2017 with no f/u 06/27/2019   Smoker cigars 06/27/2019   Asthma 06/27/2019    Past Surgical History:  Procedure Laterality Date   CESAREAN SECTION N/A    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 Authorizing Provider  benzonatate  (TESSALON ) 200 MG capsule Take 1 capsule (200 mg total) by mouth 2 (two) times  daily as needed for cough. Patient not taking: Reported on 06/10/2023 01/31/23   Jobe Mulder, DO  ondansetron  (ZOFRAN ) 4 MG tablet Take 1 tablet (4 mg total) by mouth every 8 (eight) hours as needed for vomiting or nausea. Patient not taking: Reported on 06/10/2023 12/15/22 12/15/23  Kandee Orion, MD  phentermine  37.5 MG capsule Take 1 capsule (37.5 mg total) by mouth every morning. 01/31/23   Jobe Mulder, DO    Family History Family History  Problem Relation Age of Onset   Diabetes Mother    Ovarian cysts Sister    Pancreatic cancer Maternal Grandmother    Breast cancer Neg Hx    Colon cancer Neg Hx    Cervical cancer Neg Hx    Heart disease Neg Hx     Social History Social History   Tobacco Use   Smoking status: Never   Smokeless tobacco: Never  Vaping Use   Vaping status: Never Used  Substance Use Topics   Alcohol use: Yes   Drug use: Never     Allergies   Patient has no known allergies.   Review of Systems Review of Systems  Musculoskeletal:  Positive for neck pain and neck stiffness.     Physical Exam Triage Vital Signs ED Triage Vitals  Encounter Vitals Group     BP 06/09/23 1818 110/73     Systolic BP Percentile --      Diastolic BP Percentile --      Pulse Rate 06/09/23 1818 85     Resp 06/09/23 1818 18     Temp 06/09/23 1818 98.9 F (37.2 C)     Temp Source 06/09/23 1818 Oral     SpO2 06/09/23 1818 96 %     Weight 06/09/23 1818 (!) 347 lb (157.4 kg)     Height 06/09/23 1818 5\' 5"  (1.651 m)     Head Circumference --      Peak Flow --      Pain Score 06/09/23 1839 10     Pain Loc --      Pain Education --      Exclude from Growth Chart --    No data found.  Updated Vital Signs BP 110/73 (BP Location: Right Arm)   Pulse 85   Temp 98.9 F (37.2 C) (Oral)   Resp 18   Ht 5\' 5"  (1.651 m)   Wt (!) 347 lb (157.4 kg)   LMP 05/28/2023 (Exact Date)   SpO2 96%   Breastfeeding No   BMI 57.74 kg/m   Visual Acuity Right  Eye Distance:   Left Eye Distance:   Bilateral Distance:    Right Eye Near:   Left Eye Near:    Bilateral Near:     Physical Exam Vitals reviewed.  Constitutional:      General: She is awake. She is not in acute distress.    Appearance: Normal appearance. She is well-developed and well-groomed. She is not ill-appearing or toxic-appearing.  HENT:     Head: Normocephalic and atraumatic.  Neck:      Comments: Patient has decreased range of motion particularly with lateral rotation to the right.  She is able to flex and extend her neck but this is mildly reduced.  No obvious signs of lymphadenopathy or swelling along the neck.  She does have some tenderness along the left side particularly along the sternocleidomastoid muscle Musculoskeletal:     Cervical back: Rigidity and torticollis present. No erythema. Pain with movement and muscular tenderness present. No spinous process tenderness. Decreased range of motion.  Neurological:     Mental Status: She is alert.  Psychiatric:        Behavior: Behavior is cooperative.      UC Treatments / Results  Labs (all labs ordered are listed, but only abnormal results are displayed) Labs Reviewed - No data to display  EKG   Radiology No results found.  Procedures Procedures (including critical care time)  Medications Ordered in UC Medications  acetaminophen  (TYLENOL ) tablet 975 mg (975 mg Oral Given 06/09/23 2011)    Initial Impression / Assessment and Plan / UC Course  I have reviewed the triage vital signs and the nursing notes.  Pertinent labs & imaging results that were available during my care of the patient were reviewed by me and considered in my medical decision making (see chart for details).     Spoke with on call emergency provider Dr. Heywood Louder at [facility] regarding care plan. He gave okay for Toradol  injection here in clinic with regards to recent bariatric surgery   Final Clinical Impressions(s) / UC Diagnoses   Final  diagnoses:  Torticollis, acute  Patient presents today with concerns for left-sided neck pain.  She reports this has been ongoing for about 4 days and is not improving with over-the-counter Tylenol  and heating pad.  She reports that she is having difficulty moving her neck when she has pain and difficulty with swallowing.  Physical exam is notable for spasms along the sternocleidomastoid muscle as well as decreased range of motion as noted above.  Of note patient recently had bariatric surgery and she has been instructed against having NSAIDs.  Given this surgical history I am concerned about providing her with a steroid at this time as well.  Patient did try to contact her surgical team at [facility] for further clarification.  I spoke with the on-call emergency provider and he stated that Toradol  would be appropriate if desired.  Patient and her mother (mother was present via video chat) expressed concerns for potential complications from steroid injection and Toradol  shot.  Patient stated that she would feel more comfortable speaking to her operating surgeon rather than on-call provider for further clarification and if given the okay for them would come back for further management.  Provided patient with Tylenol  975 mg today.  Recommend warm compress to the area, lidocaine  patches, Voltaren gel as desired.  Will also provide gentle stretches to assist with mobility.  Follow-up as needed.    Discharge Instructions      You were seen today for left sided neck pain and stiffness At this time your symptoms appear consistent with a condition called torticollis.  This is essentially when the muscles of the neck contract and tighten causing difficulty with moving or turning the head.  This usually starts to resolve with home measures and over-the-counter medications.  For now I recommend warm compresses to the area, gentle stretches and massage as tolerated, lidocaine  patches and Voltaren gel as needed.  We have  provided you with acetaminophen  975 mg here in the clinic to assist with pain. Please remember that you can take up to 3500 mg of Tylenol  per 24 hours as needed in divided doses I have included some stretches and further information regarding torticollis in your paperwork.  If you feel like your symptoms are not improving over the next 1 to 2 weeks you can follow-up with orthopedics or your primary care provider for potential referral to physical therapy.    ED Prescriptions   None    PDMP not reviewed this encounter.   Jerona Mooring, PA-C 06/10/23 1859

## 2023-06-09 NOTE — Discharge Instructions (Addendum)
 You were seen today for left sided neck pain and stiffness At this time your symptoms appear consistent with a condition called torticollis.  This is essentially when the muscles of the neck contract and tighten causing difficulty with moving or turning the head.  This usually starts to resolve with home measures and over-the-counter medications.  For now I recommend warm compresses to the area, gentle stretches and massage as tolerated, lidocaine  patches and Voltaren gel as needed.  We have provided you with acetaminophen  975 mg here in the clinic to assist with pain. Please remember that you can take up to 3500 mg of Tylenol  per 24 hours as needed in divided doses I have included some stretches and further information regarding torticollis in your paperwork.  If you feel like your symptoms are not improving over the next 1 to 2 weeks you can follow-up with orthopedics or your primary care provider for potential referral to physical therapy.

## 2023-06-10 ENCOUNTER — Ambulatory Visit
Admission: RE | Admit: 2023-06-10 | Discharge: 2023-06-10 | Disposition: A | Discharge status: Home / Self Care | Payer: Self-pay | Source: Ambulatory Visit | Attending: Nurse Practitioner

## 2023-06-10 VITALS — BP 139/76 | HR 84 | Temp 98.0°F | Resp 16

## 2023-06-10 DIAGNOSIS — M542 Cervicalgia: Secondary | ICD-10-CM

## 2023-06-10 DIAGNOSIS — M436 Torticollis: Secondary | ICD-10-CM

## 2023-06-10 MED ORDER — KETOROLAC TROMETHAMINE 60 MG/2ML IM SOLN
60.0000 mg | Freq: Once | INTRAMUSCULAR | Status: AC
  Administered 2023-06-10: 60 mg via INTRAMUSCULAR

## 2023-06-10 MED ORDER — METHOCARBAMOL 500 MG PO TABS: 500.0000 mg | ORAL_TABLET | Freq: Every morning | ORAL | 0 refills | Status: AC

## 2023-06-10 MED ORDER — CYCLOBENZAPRINE HCL 10 MG PO TABS: 10.0000 mg | ORAL_TABLET | Freq: Every day | ORAL | 0 refills | Status: AC

## 2023-06-10 MED ORDER — PREDNISONE 20 MG PO TABS: 20.0000 mg | ORAL_TABLET | Freq: Every day | ORAL | 0 refills | Status: AC

## 2023-06-10 NOTE — Discharge Instructions (Addendum)
 You were seen today for continued neck pain due to acute torticollis, which was diagnosed yesterday. This is a painful spasm of the neck muscles that can make it difficult to move your head and may occur after minor strain, poor posture, or even without a clear cause. You received a Toradol  injection in the clinic today to help reduce inflammation and relieve pain. You were also prescribed prednisone  20 mg to take once daily for the next five days to help reduce inflammation, along with methocarbamol  to take in the morning and cyclobenzaprine to take at night to help relax your muscles. You may also take Tylenol  as needed for additional pain relief. Supportive care measures such as applying moist warm compresses to the neck and performing gentle neck stretches may help improve comfort and mobility as you recover. If your symptoms do not improve or worsen, you may need follow-up with an orthopedic specialist.

## 2023-06-10 NOTE — ED Provider Notes (Signed)
 [facility]    CSN: [id number] Arrival date & time: 06/10/23  1731      History   Chief Complaint Chief Complaint  Patient presents with   Neck Pain    Follow up (Toradol  shot)    HPI Heather Barron is a 28 y.o. female.    Heather Barron is a 28 year old female presenting with persistent neck pain and stiffness. She was evaluated in the clinic yesterday and diagnosed with acute torticollis. At that time, she was advised to use over-the-counter lidocaine  patches, Voltaren gel, and acetaminophen  for symptom relief, along with warm compresses and neck exercises. Due to her history of bariatric surgery approximately six weeks ago, there was initial apprehension from the patient regarding receiving a Toradol  injection. Earlier today, she consulted with her bariatric surgeon, who advised that a short course of NSAIDs or prednisone  would be acceptable at this point in her recovery. The patient reports that her neck pain remains present but has not worsened since yesterday. She denies any numbness, tingling in the shoulders or fingers, arm weakness, or headaches. Her primary reason for returning to the clinic today is to receive a Toradol  injection for better symptom control.  The following portions of the patient's history were reviewed and updated as appropriate: allergies, current medications, past family history, past medical history, past social history, past surgical history, and problem list.      Past Medical History:  Diagnosis Date   Sleep apnea    on CPAP    Patient Active Problem List   Diagnosis Date Noted   Abdominal pain 11/14/2020   Cholelithiasis 11/14/2020   OSA on CPAP 11/14/2020   Hypochromic anemia 11/14/2020   Transaminitis 11/14/2020   Biliary dyskinesia 11/14/2020   Severe preeclampsia, third trimester 10/15/2020   Morbid obesity (HCC) 06/27/2019   Depression 06/27/2019   Hidradenitis suppurativa 06/27/2019   History of abnormal pap  LSIL 08/24/2017 with no f/u 06/27/2019   Smoker cigars 06/27/2019   Asthma 06/27/2019    Past Surgical History:  Procedure Laterality Date   CESAREAN SECTION N/A    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 Authorizing Provider  cyclobenzaprine (FLEXERIL) 10 MG tablet Take 1 tablet (10 mg total) by mouth at bedtime. 06/10/23  Yes Myron Stankovich, FNP  methocarbamol  (ROBAXIN ) 500 MG tablet Take 1 tablet (500 mg total) by mouth every morning for 5 days. 06/10/23 06/15/23 Yes Ebonie Westerlund, FNP  predniSONE  (DELTASONE ) 20 MG tablet Take 1 tablet (20 mg total) by mouth daily with breakfast for 5 days. 06/10/23 06/15/23 Yes Maryruth Sol, FNP    Family History Family History  Problem Relation Age of Onset   Diabetes Mother    Ovarian cysts Sister    Pancreatic cancer Maternal Grandmother    Breast cancer Neg Hx    Colon cancer Neg Hx    Cervical cancer Neg Hx    Heart disease Neg Hx     Social History Social  History   Tobacco Use   Smoking status: Never   Smokeless tobacco: Never  Vaping Use   Vaping status: Never Used  Substance Use Topics   Alcohol use: Yes   Drug use: Never     Allergies   Patient has no known allergies.   Review of Systems Review of Systems  Musculoskeletal:  Positive for neck pain and neck stiffness.  Neurological:  Negative for weakness, numbness and headaches.  All other systems reviewed and are negative.    Physical Exam Triage Vital Signs ED Triage Vitals [06/10/23 1752]  Encounter Vitals Group     BP 139/76     Systolic BP Percentile      Diastolic BP Percentile      Pulse Rate 84     Resp 16     Temp 98 F (36.7 C)     Temp Source Oral     SpO2 94 %      Weight      Height      Head Circumference      Peak Flow      Pain Score      Pain Loc      Pain Education      Exclude from Growth Chart    No data found.  Updated Vital Signs BP 139/76 (BP Location: Right Arm)   Pulse 84   Temp 98 F (36.7 C) (Oral)   Resp 16   LMP 05/28/2023 (Exact Date)   SpO2 94%   Visual Acuity Right Eye Distance:   Left Eye Distance:   Bilateral Distance:    Right Eye Near:   Left Eye Near:    Bilateral Near:     Physical Exam Vitals and nursing note reviewed.  Constitutional:      General: She is awake. She is not in acute distress.    Appearance: Normal appearance. She is well-developed. She is morbidly obese. She is not ill-appearing, toxic-appearing or diaphoretic.  HENT:     Head: Normocephalic.     Mouth/Throat:     Mouth: Mucous membranes are moist.  Eyes:     Conjunctiva/sclera: Conjunctivae normal.  Cardiovascular:     Rate and Rhythm: Normal rate and regular rhythm.     Heart sounds: Normal heart sounds.  Pulmonary:     Effort: Pulmonary effort is normal.     Breath sounds: Normal breath sounds.  Musculoskeletal:     Cervical back: Neck supple. No rigidity. Pain with movement and muscular tenderness present. No spinous process tenderness. Decreased range of motion (due to pain).  Lymphadenopathy:     Cervical: No cervical adenopathy.  Skin:    General: Skin is warm and dry.  Neurological:     General: No focal deficit present.     Mental Status: She is alert and oriented to person, place, and time.  Psychiatric:        Behavior: Behavior is cooperative.      UC Treatments / Results  Labs (all labs ordered are listed, but only abnormal results are displayed) Labs Reviewed - No data to display  EKG   Radiology No results found.  Procedures Procedures (including critical care time)  Medications Ordered in UC Medications  ketorolac  (TORADOL ) injection 60 mg (60 mg Intramuscular Given 06/10/23 1943)     Initial Impression / Assessment and Plan / UC Course  I have reviewed the triage vital signs and the nursing notes.  Pertinent labs & imaging results that were available during  my care of the patient were reviewed by me and considered in my medical decision making (see chart for details).     28 year old female with history of bariatric surgery about 6 weeks ago diagnosed 1 day ago with acute torticollis presenting for a Toradol  injection.  She is afebrile and nontoxic.  Appears well.  Toradol  60 mg IM given in the clinic.  Patient prescribed 20 mg of prednisone  daily for the next 5 days as well as methocarbamol  in the morning and cyclobenzaprine at night.  She can take Tylenol  as needed with this regimen.  Reviewed supportive care measures such as neck stretches/gentle exercise and moist warm compresses.  Orthopedic follow-up as needed.  Today's evaluation has revealed no signs of a dangerous process. Discussed diagnosis with patient and/or guardian. Patient and/or guardian aware of their diagnosis, possible red flag symptoms to watch out for and need for close follow up. Patient and/or guardian understands verbal and written discharge instructions. Patient and/or guardian comfortable with plan and disposition.  Patient and/or guardian has a clear mental status at this time, good insight into illness (after discussion and teaching) and has clear judgment to make decisions regarding their care  Documentation was completed with the aid of voice recognition software. Transcription may contain typographical errors. Final Clinical Impressions(s) / UC Diagnoses   Final diagnoses:  Neck pain  Acute torticollis     Discharge Instructions      You were seen today for continued neck pain due to acute torticollis, which was diagnosed yesterday. This is a painful spasm of the neck muscles that can make it difficult to move your head and may occur after minor strain, poor posture, or even without a  clear cause. You received a Toradol  injection in the clinic today to help reduce inflammation and relieve pain. You were also prescribed prednisone  20 mg to take once daily for the next five days to help reduce inflammation, along with methocarbamol  to take in the morning and cyclobenzaprine to take at night to help relax your muscles. You may also take Tylenol  as needed for additional pain relief. Supportive care measures such as applying moist warm compresses to the neck and performing gentle neck stretches may help improve comfort and mobility as you recover. If your symptoms do not improve or worsen, you may need follow-up with an orthopedic specialist.   ED Prescriptions     Medication Sig Dispense Auth. Provider   predniSONE  (DELTASONE ) 20 MG tablet Take 1 tablet (20 mg total) by mouth daily with breakfast for 5 days. 5 tablet Akirah Storck, FNP   cyclobenzaprine (FLEXERIL) 10 MG tablet Take 1 tablet (10 mg total) by mouth at bedtime. 5 tablet Maryruth Sol, FNP   methocarbamol  (ROBAXIN ) 500 MG tablet Take 1 tablet (500 mg total) by mouth every morning for 5 days. 5 tablet Maryruth Sol, FNP      PDMP not reviewed this encounter.   Maryruth Sol, [place] 06/10/23 [phone]

## 2023-06-10 NOTE — ED Triage Notes (Addendum)
 Pt presents with neck pain for 5 days. She was seen at [facility] last night but had to check with bariatric provider to make sure she could receive Toradol . Returns to UC today to get Toradol  shot

## 2023-08-30 IMAGING — NM NM HEPATO W/GB/PHARM/[PERSON_NAME]
2 series · 12 of 12 positions shown · non-contrast
Comparison: November 13, 2020.

CLINICAL DATA: Epigastric abdominal pain.

EXAM:
NUCLEAR MEDICINE HEPATOBILIARY IMAGING WITH GALLBLADDER EF
TECHNIQUE: Sequential images of the abdomen were obtained [DATE] minutes
following intravenous administration of radiopharmaceutical. After
oral ingestion of Ensure, gallbladder ejection fraction was
determined. At 60 min, normal ejection fraction is greater than 33%.
RADIOPHARMACEUTICALS:  5.1 mCi Bc-88m  Choletec IV

[he hepatobiliary · 4.52mm/px · 6 of 60 frames shown (1 of 2)]
[frame 6/60]
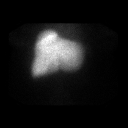
[frame 16/60]
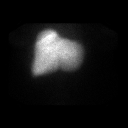
[frame 26/60]
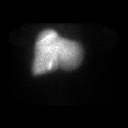
[frame 36/60]
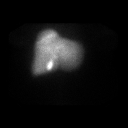
[frame 46/60]
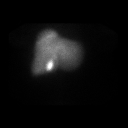
[frame 56/60]
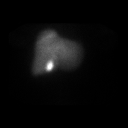

[he hepatobiliary · 4.52mm/px · 6 of 60 frames shown (2 of 2)]
[frame 6/60]
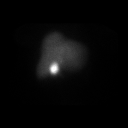
[frame 16/60]
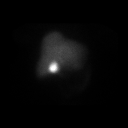
[frame 26/60]
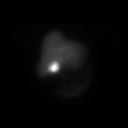
[frame 36/60]
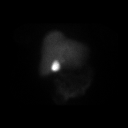
[frame 46/60]
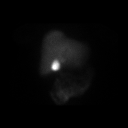
[frame 56/60]
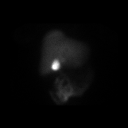

[12 of 12 positions shown; findings below may reference images not displayed]

FINDINGS: Prompt uptake and biliary excretion of activity by the liver is
seen. Gallbladder activity is visualized, consistent with patency of
cystic duct. Biliary activity passes into small bowel, consistent
with patent common bile duct.

Calculated gallbladder ejection fraction is 22%. (Normal gallbladder
ejection fraction with Ensure is greater than 33%.) Patient reported
no pain after Ensure administration.
IMPRESSION: Gallbladder ejection fraction of 22% was measured after Ensure
administration which is below normal. This is concerning for biliary
dyskinesis.

## 2023-09-20 ENCOUNTER — Ambulatory Visit
Admission: RE | Admit: 2023-09-20 | Discharge: 2023-09-20 | Disposition: A | Discharge status: Home / Self Care | Attending: Physician Assistant | Admitting: Physician Assistant

## 2023-09-20 ENCOUNTER — Other Ambulatory Visit: Payer: Self-pay

## 2023-09-20 VITALS — BP 115/79 | HR 77 | Temp 97.9°F | Resp 17 | Ht 65.0 in | Wt 350.0 lb

## 2023-09-20 DIAGNOSIS — H109 Unspecified conjunctivitis: Secondary | ICD-10-CM

## 2023-09-20 MED ORDER — ERYTHROMYCIN 5 MG/GM OP OINT
TOPICAL_OINTMENT | Freq: Four times a day (QID) | OPHTHALMIC | 0 refills | Status: AC
Start: 2023-09-20 — End: 2023-09-27

## 2023-09-20 NOTE — ED Triage Notes (Addendum)
 Pt presents with complaints of left eye problem. States her symptoms mainly started today. Left eye is red, there is drainage, and she is having some blurred vision. Pt reports she did notice a bump in the corner of left eye that has been causing her pain two days ago. Currently rates overall pain a 4/10. Denies taking/applying medications PTA.

## 2023-09-20 NOTE — ED Provider Notes (Addendum)
 [facility]    CSN: [id number] Arrival date & time: 09/20/23  1113      History   Chief Complaint Chief Complaint  Patient presents with   Eye Problem    HPI Heather Barron is a 28 y.o. female.   HPI  Pt states that her left eye started to have drainage and redness yesterday. She reports she tested positive for COVID on Sat at clinic in [place]. She reports there is a bump on the side of if that is painful but the eye itself does not hurt. She reports some blurry vision. She also reports purulent appearing drainage. She denies use of contact lenses   I have reviewed patient's encounter notes from 09/17/2023 as well as testing results for COVID, flu, RSV testing.  Past Medical History:  Diagnosis Date   Sleep apnea    on CPAP    Patient Active Problem List   Diagnosis Date Noted   Abdominal pain 11/14/2020   Cholelithiasis 11/14/2020   OSA on CPAP 11/14/2020   Hypochromic anemia 11/14/2020   Transaminitis 11/14/2020   Biliary dyskinesia 11/14/2020   Severe preeclampsia, third trimester 10/15/2020   Morbid obesity (HCC) 06/27/2019   Depression 06/27/2019   Hidradenitis suppurativa 06/27/2019   History of abnormal pap LSIL 08/24/2017 with no f/u 06/27/2019   Smoker cigars 06/27/2019   Asthma 06/27/2019    Past Surgical History:  Procedure Laterality Date   CESAREAN SECTION N/A    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 Authorizing Provider  erythromycin  ophthalmic ointment Place into the left eye 4 (four) times daily for 7 days. Place a 1/2 inch ribbon of ointment into the lower eyelid. 09/20/23 09/27/23 Yes Willies Laviolette E, PA-C  cyclobenzaprine  (FLEXERIL ) 10 MG tablet Take 1  tablet (10 mg total) by mouth at bedtime. 06/10/23   Iola Lukes, FNP    Family History Family History  Problem Relation Age of Onset   Diabetes Mother    Ovarian cysts Sister    Pancreatic cancer Maternal Grandmother    Breast cancer Neg Hx    Colon cancer Neg Hx    Cervical cancer Neg Hx    Heart disease Neg Hx     Social History Social History   Tobacco Use   Smoking status: Never   Smokeless tobacco: Never  Vaping Use   Vaping status: Never Used  Substance Use Topics   Alcohol use: Yes   Drug use: Never     Allergies   Patient has no known allergies.   Review of Systems Review of Systems  Eyes:  Positive for discharge and redness.     Physical Exam Triage Vital Signs ED Triage Vitals  Encounter Vitals Group     BP 09/20/23 1130 94/69     Girls Systolic BP Percentile --      Girls Diastolic BP Percentile --      Boys Systolic BP Percentile --  Boys Diastolic BP Percentile --      Pulse Rate 09/20/23 1130 77     Resp 09/20/23 1130 17     Temp 09/20/23 1130 97.9 F (36.6 C)     Temp Source 09/20/23 1130 Oral     SpO2 09/20/23 1130 97 %     Weight 09/20/23 1130 (!) 305 lb (138.3 kg)     Height 09/20/23 1130 5' 5 (1.651 m)     Head Circumference --      Peak Flow --      Pain Score 09/20/23 1143 4     Pain Loc --      Pain Education --      Exclude from Growth Chart --    No data found.  Updated Vital Signs BP 115/79 (BP Location: Right Arm)   Pulse 77   Temp 97.9 F (36.6 C) (Oral)   Resp 17   Ht 5' 5 (1.651 m)   Wt (!) 350 lb (158.8 kg)   LMP 09/17/2023 (Approximate)   SpO2 97%   BMI 58.24 kg/m   Visual Acuity Right Eye Distance:   Left Eye Distance:   Bilateral Distance:    Right Eye Near:   Left Eye Near:    Bilateral Near:     Physical Exam Vitals reviewed.  Constitutional:      General: She is awake.     Appearance: Normal appearance. She is well-developed and well-groomed.  HENT:     Head: Normocephalic and  atraumatic.  Eyes:     General: Lids are normal. Gaze aligned appropriately.        Right eye: Discharge present. No foreign body or hordeolum.        Left eye: Discharge present.No foreign body or hordeolum.     Extraocular Movements: Extraocular movements intact.     Conjunctiva/sclera:     Left eye: Left conjunctiva is injected.     Pupils: Pupils are equal, round, and reactive to light.     Comments: Pt has mild injection of the left eye with purulent discharge present.   Pulmonary:     Effort: Pulmonary effort is normal.  Neurological:     Mental Status: She is alert and oriented to person, place, and time.  Psychiatric:        Attention and Perception: Attention and perception normal.        Mood and Affect: Mood and affect normal.        Speech: Speech normal.        Behavior: Behavior normal. Behavior is cooperative.      UC Treatments / Results  Labs (all labs ordered are listed, but only abnormal results are displayed) Labs Reviewed - No data to display  EKG   Radiology No results found.  Procedures Procedures (including critical care time)  Medications Ordered in UC Medications - No data to display  Initial Impression / Assessment and Plan / UC Course  I have reviewed the triage vital signs and the nursing notes.  Pertinent labs & imaging results that were available during my care of the patient were reviewed by me and considered in my medical decision making (see chart for details).      Final Clinical Impressions(s) / UC Diagnoses   Final diagnoses:  Bacterial conjunctivitis   Patient presents today with concerns for purulent drainage and eye redness in the left eye that started yesterday.  Patient reports that she did test positive for COVID on Saturday at the  ED in [place].  Physical exam is notable for mild injection as well as purulent appearing drainage coating the lower eyelash and crusted drainage around the eyes.  Physical exam appears  consistent with potential bacterial conjunctivitis but there can also be concern for viral conjunctivitis given current COVID infection..  Will start erythromycin  ophthalmic ointment given presentation today.  Reviewed OTC medications and eyedrops to further assist with symptoms.  ED and return precautions reviewed and provided in AVS.  Follow-up as needed.    Discharge Instructions      Based on your symptoms I believe that you have bacterial conjunctivitis  I have sent in a script for Erythromycin  ophthalmic ointment - please apply a 1/2 inch strip of the ointment to your left eye every 6 hours for 7 days  You can use sterile eye flushes and lubricating eye drops to assist with eye irritation and further resolution You can also use a warm compress over the eye to assist with swelling and matting - especially in the morning  If you have used makeup or mascara on that eye I recommend discarding it as this can cause recurrent infection. Thoroughly wash any makeup brushes and avoid using makeup while recovering from the infection.  If you notice the following please return to the office: lack of improvement, eyelid swelling, increased eye irritation If you notice the following please go to the ED: eye pressure causing displacement of the eye, vision changes, increased eye pain or foreign body sensation, fever      ED Prescriptions     Medication Sig Dispense Auth. Provider   erythromycin  ophthalmic ointment Place into the left eye 4 (four) times daily for 7 days. Place a 1/2 inch ribbon of ointment into the lower eyelid. 3.5 g Kosta Schnitzler E, PA-C      PDMP not reviewed this encounter.   Taraji Mungo, Rocky BRAVO, PA-C 09/20/23 1223    Abrar Bilton, Rocky BRAVO, PA-C 09/20/23 1224

## 2023-09-20 NOTE — Discharge Instructions (Addendum)
 Based on your symptoms I believe that you have bacterial conjunctivitis  I have sent in a script for Erythromycin  ophthalmic ointment - please apply a 1/2 inch strip of the ointment to your left eye every 6 hours for 7 days  You can use sterile eye flushes and lubricating eye drops to assist with eye irritation and further resolution You can also use a warm compress over the eye to assist with swelling and matting - especially in the morning  If you have used makeup or mascara on that eye I recommend discarding it as this can cause recurrent infection. Thoroughly wash any makeup brushes and avoid using makeup while recovering from the infection.  If you notice the following please return to the office: lack of improvement, eyelid swelling, increased eye irritation If you notice the following please go to the ED: eye pressure causing displacement of the eye, vision changes, increased eye pain or foreign body sensation, fever
